# Patient Record
Sex: Female | Born: 1940 | Race: White | Hispanic: No | Smoking: Former smoker
Health system: Southern US, Community
[De-identification: ages and names within clinical notes are randomized; demographics above are authoritative.]

## PROBLEM LIST (undated history)

## (undated) DIAGNOSIS — E785 Hyperlipidemia, unspecified: Secondary | ICD-10-CM

## (undated) DIAGNOSIS — E079 Disorder of thyroid, unspecified: Secondary | ICD-10-CM

## (undated) DIAGNOSIS — E876 Hypokalemia: Secondary | ICD-10-CM

## (undated) DIAGNOSIS — R0600 Dyspnea, unspecified: Secondary | ICD-10-CM

## (undated) DIAGNOSIS — K5792 Diverticulitis of intestine, part unspecified, without perforation or abscess without bleeding: Secondary | ICD-10-CM

## (undated) HISTORY — PX: EYE SURGERY: SHX253

## (undated) HISTORY — DX: Dyspnea, unspecified: R06.00

## (undated) HISTORY — DX: Hypokalemia: E87.6

## (undated) HISTORY — DX: Hyperlipidemia, unspecified: E78.5

---

## 1999-11-04 ENCOUNTER — Other Ambulatory Visit: Admission: RE | Admit: 1999-11-04 | Discharge: 1999-11-04 | Payer: Self-pay | Admitting: Obstetrics and Gynecology

## 1999-11-21 ENCOUNTER — Encounter: Payer: Self-pay | Admitting: Obstetrics and Gynecology

## 1999-11-21 ENCOUNTER — Encounter: Admission: RE | Admit: 1999-11-21 | Discharge: 1999-11-21 | Payer: Self-pay | Admitting: Obstetrics and Gynecology

## 2000-11-01 ENCOUNTER — Other Ambulatory Visit: Admission: RE | Admit: 2000-11-01 | Discharge: 2000-11-01 | Payer: Self-pay | Admitting: Obstetrics and Gynecology

## 2000-11-21 ENCOUNTER — Encounter: Payer: Self-pay | Admitting: Obstetrics and Gynecology

## 2000-11-21 ENCOUNTER — Encounter: Admission: RE | Admit: 2000-11-21 | Discharge: 2000-11-21 | Payer: Self-pay | Admitting: Obstetrics and Gynecology

## 2001-11-01 ENCOUNTER — Other Ambulatory Visit: Admission: RE | Admit: 2001-11-01 | Discharge: 2001-11-01 | Payer: Self-pay | Admitting: Obstetrics and Gynecology

## 2001-11-25 ENCOUNTER — Encounter: Admission: RE | Admit: 2001-11-25 | Discharge: 2001-11-25 | Payer: Self-pay | Admitting: Obstetrics and Gynecology

## 2001-11-25 ENCOUNTER — Encounter: Payer: Self-pay | Admitting: Obstetrics and Gynecology

## 2002-10-15 ENCOUNTER — Other Ambulatory Visit: Admission: RE | Admit: 2002-10-15 | Discharge: 2002-10-15 | Payer: Self-pay | Admitting: Obstetrics and Gynecology

## 2003-01-23 ENCOUNTER — Encounter: Admission: RE | Admit: 2003-01-23 | Discharge: 2003-01-23 | Payer: Self-pay | Admitting: Obstetrics and Gynecology

## 2003-01-23 ENCOUNTER — Encounter: Payer: Self-pay | Admitting: Obstetrics and Gynecology

## 2003-11-19 ENCOUNTER — Other Ambulatory Visit: Admission: RE | Admit: 2003-11-19 | Discharge: 2003-11-19 | Payer: Self-pay | Admitting: Obstetrics and Gynecology

## 2004-01-26 ENCOUNTER — Ambulatory Visit (HOSPITAL_COMMUNITY): Admission: RE | Admit: 2004-01-26 | Discharge: 2004-01-26 | Payer: Self-pay | Admitting: Obstetrics and Gynecology

## 2004-11-24 ENCOUNTER — Other Ambulatory Visit: Admission: RE | Admit: 2004-11-24 | Discharge: 2004-11-24 | Payer: Self-pay | Admitting: Obstetrics and Gynecology

## 2005-01-27 ENCOUNTER — Ambulatory Visit (HOSPITAL_COMMUNITY): Admission: RE | Admit: 2005-01-27 | Discharge: 2005-01-27 | Payer: Self-pay | Admitting: Obstetrics and Gynecology

## 2005-04-18 ENCOUNTER — Ambulatory Visit (HOSPITAL_COMMUNITY): Admission: RE | Admit: 2005-04-18 | Discharge: 2005-04-19 | Payer: Self-pay | Admitting: Ophthalmology

## 2005-10-11 ENCOUNTER — Ambulatory Visit: Payer: Self-pay | Admitting: Orthopedic Surgery

## 2006-01-30 ENCOUNTER — Ambulatory Visit (HOSPITAL_COMMUNITY): Admission: RE | Admit: 2006-01-30 | Discharge: 2006-01-30 | Payer: Self-pay | Admitting: Obstetrics and Gynecology

## 2007-02-14 ENCOUNTER — Ambulatory Visit (HOSPITAL_COMMUNITY): Admission: RE | Admit: 2007-02-14 | Discharge: 2007-02-14 | Payer: Self-pay | Admitting: Obstetrics and Gynecology

## 2007-02-21 ENCOUNTER — Ambulatory Visit (HOSPITAL_COMMUNITY): Admission: RE | Admit: 2007-02-21 | Discharge: 2007-02-21 | Payer: Self-pay | Admitting: Ophthalmology

## 2008-02-18 ENCOUNTER — Ambulatory Visit (HOSPITAL_COMMUNITY): Admission: RE | Admit: 2008-02-18 | Discharge: 2008-02-18 | Payer: Self-pay | Admitting: Obstetrics and Gynecology

## 2008-05-08 ENCOUNTER — Observation Stay (HOSPITAL_COMMUNITY): Admission: EM | Admit: 2008-05-08 | Discharge: 2008-05-10 | Payer: Self-pay | Admitting: Emergency Medicine

## 2008-08-19 ENCOUNTER — Ambulatory Visit: Payer: Self-pay | Admitting: Gastroenterology

## 2008-09-04 ENCOUNTER — Ambulatory Visit: Payer: Self-pay | Admitting: Gastroenterology

## 2009-01-13 ENCOUNTER — Encounter: Payer: Self-pay | Admitting: Adult Health

## 2009-01-13 LAB — CONVERTED CEMR LAB
ALT: 11 units/L
Alkaline Phosphatase: 57 units/L
BUN: 12 mg/dL
Bilirubin, Direct: 1 mg/dL
Calcium: 9 mg/dL
Creatinine, Ser: 0.79 mg/dL
HCT: 41.1 %
Platelets: 254 10*3/uL
Sodium: 144 meq/L
Total Protein: 7 g/dL
Triglycerides: 58 mg/dL

## 2009-02-18 ENCOUNTER — Ambulatory Visit (HOSPITAL_COMMUNITY): Admission: RE | Admit: 2009-02-18 | Discharge: 2009-02-18 | Payer: Self-pay | Admitting: Obstetrics and Gynecology

## 2009-07-06 ENCOUNTER — Ambulatory Visit (HOSPITAL_COMMUNITY): Admission: RE | Admit: 2009-07-06 | Discharge: 2009-07-06 | Payer: Self-pay | Admitting: Family Medicine

## 2009-07-19 ENCOUNTER — Ambulatory Visit: Payer: Self-pay | Admitting: Cardiology

## 2009-07-19 ENCOUNTER — Observation Stay (HOSPITAL_COMMUNITY): Admission: EM | Admit: 2009-07-19 | Discharge: 2009-07-20 | Payer: Self-pay | Admitting: Emergency Medicine

## 2009-07-20 ENCOUNTER — Encounter: Payer: Self-pay | Admitting: Cardiology

## 2009-07-22 ENCOUNTER — Encounter (HOSPITAL_COMMUNITY): Admission: RE | Admit: 2009-07-22 | Discharge: 2009-08-05 | Payer: Self-pay | Admitting: Cardiology

## 2009-07-22 ENCOUNTER — Ambulatory Visit: Payer: Self-pay | Admitting: Cardiology

## 2009-07-25 ENCOUNTER — Ambulatory Visit: Payer: Self-pay | Admitting: Cardiovascular Disease

## 2009-07-25 ENCOUNTER — Observation Stay (HOSPITAL_COMMUNITY): Admission: EM | Admit: 2009-07-25 | Discharge: 2009-07-27 | Payer: Self-pay | Admitting: Internal Medicine

## 2009-07-25 ENCOUNTER — Encounter: Payer: Self-pay | Admitting: Emergency Medicine

## 2009-07-27 ENCOUNTER — Encounter: Payer: Self-pay | Admitting: Adult Health

## 2009-07-27 LAB — CONVERTED CEMR LAB
Hemoglobin: 12.2 g/dL
WBC: 10.9 10*3/uL

## 2009-08-11 ENCOUNTER — Ambulatory Visit: Payer: Self-pay | Admitting: Adult Health

## 2009-08-11 DIAGNOSIS — R079 Chest pain, unspecified: Secondary | ICD-10-CM | POA: Insufficient documentation

## 2009-08-11 LAB — CONVERTED CEMR LAB
MCV: 89.8 fL
Platelets: 204 10*3/uL
WBC: 10.9 10*3/uL

## 2010-03-01 ENCOUNTER — Ambulatory Visit (HOSPITAL_COMMUNITY): Admission: RE | Admit: 2010-03-01 | Discharge: 2010-03-01 | Payer: Self-pay | Admitting: Obstetrics and Gynecology

## 2011-02-06 ENCOUNTER — Other Ambulatory Visit (HOSPITAL_COMMUNITY): Payer: Self-pay | Admitting: Obstetrics and Gynecology

## 2011-02-06 DIAGNOSIS — Z1231 Encounter for screening mammogram for malignant neoplasm of breast: Secondary | ICD-10-CM

## 2011-02-10 LAB — CARDIAC PANEL(CRET KIN+CKTOT+MB+TROPI)
CK, MB: 1.1 ng/mL (ref 0.3–4.0)
CK, MB: 1.3 ng/mL (ref 0.3–4.0)
CK, MB: 1.4 ng/mL (ref 0.3–4.0)
Relative Index: 1.1 (ref 0.0–2.5)
Relative Index: 1.3 (ref 0.0–2.5)
Relative Index: INVALID (ref 0.0–2.5)
Total CK: 111 U/L (ref 7–177)
Total CK: 117 U/L (ref 7–177)
Total CK: 97 U/L (ref 7–177)
Troponin I: 0.01 ng/mL (ref 0.00–0.06)
Troponin I: 0.01 ng/mL (ref 0.00–0.06)
Troponin I: 0.02 ng/mL (ref 0.00–0.06)
Troponin I: 0.02 ng/mL (ref 0.00–0.06)
Troponin I: 0.02 ng/mL (ref 0.00–0.06)

## 2011-02-10 LAB — CBC
HCT: 33.4 % — ABNORMAL LOW (ref 36.0–46.0)
HCT: 36 % (ref 36.0–46.0)
HCT: 36.8 % (ref 36.0–46.0)
HCT: 37.6 % (ref 36.0–46.0)
Hemoglobin: 11.5 g/dL — ABNORMAL LOW (ref 12.0–15.0)
Hemoglobin: 12.2 g/dL (ref 12.0–15.0)
Hemoglobin: 12.8 g/dL (ref 12.0–15.0)
MCHC: 33.8 g/dL (ref 30.0–36.0)
MCHC: 34.4 g/dL (ref 30.0–36.0)
MCHC: 34.8 g/dL (ref 30.0–36.0)
MCV: 88.2 fL (ref 78.0–100.0)
MCV: 89.1 fL (ref 78.0–100.0)
MCV: 89.8 fL (ref 78.0–100.0)
MCV: 87.1 fL (ref 78.0–100.0)
Platelets: 204 K/uL (ref 150–400)
Platelets: 229 K/uL (ref 150–400)
Platelets: 230 K/uL (ref 150–400)
Platelets: 239 10*3/uL (ref 150–400)
RBC: 3.72 MIL/uL — ABNORMAL LOW (ref 3.87–5.11)
RBC: 4.04 MIL/uL (ref 3.87–5.11)
RBC: 4.17 MIL/uL (ref 3.87–5.11)
RDW: 13 % (ref 11.5–15.5)
RDW: 13 % (ref 11.5–15.5)
RDW: 13.1 % (ref 11.5–15.5)
WBC: 10.9 K/uL — ABNORMAL HIGH (ref 4.0–10.5)
WBC: 7.1 K/uL (ref 4.0–10.5)
WBC: 9.1 K/uL (ref 4.0–10.5)

## 2011-02-10 LAB — COMPREHENSIVE METABOLIC PANEL WITH GFR
ALT: 16 U/L (ref 0–35)
AST: 18 U/L (ref 0–37)
Albumin: 3.5 g/dL (ref 3.5–5.2)
Alkaline Phosphatase: 48 U/L (ref 39–117)
BUN: 7 mg/dL (ref 6–23)
CO2: 32 meq/L (ref 19–32)
Calcium: 9 mg/dL (ref 8.4–10.5)
Chloride: 105 meq/L (ref 96–112)
Creatinine, Ser: 0.89 mg/dL (ref 0.4–1.2)
GFR calc Af Amer: >60 mL/min (ref >60)
GFR calc non Af Amer: >60 mL/min (ref >60)
Glucose, Bld: 100 mg/dL — ABNORMAL HIGH (ref 70–99)
Potassium: 3.3 meq/L — ABNORMAL LOW (ref 3.5–5.1)
Sodium: 140 meq/L (ref 135–145)
Total Bilirubin: 1.4 mg/dL — ABNORMAL HIGH (ref 0.3–1.2)
Total Protein: 6.7 g/dL (ref 6.0–8.3)

## 2011-02-10 LAB — BASIC METABOLIC PANEL
BUN: 10 mg/dL (ref 6–23)
CO2: 22 mEq/L (ref 19–32)
CO2: 25 mEq/L (ref 19–32)
Chloride: 108 mEq/L (ref 96–112)
Creatinine, Ser: 0.72 mg/dL (ref 0.4–1.2)
GFR calc Af Amer: >60 mL/min (ref >60)
GFR calc Af Amer: >60 mL/min (ref >60)
GFR calc non Af Amer: >60 mL/min (ref >60)
Sodium: 139 mEq/L (ref 135–145)

## 2011-02-10 LAB — DIFFERENTIAL
Basophils Absolute: 0 K/uL (ref 0.0–0.1)
Basophils Relative: 0 % (ref 0–1)
Eosinophils Absolute: 0.1 K/uL (ref 0.0–0.7)
Eosinophils Relative: 2 % (ref 0–5)
Eosinophils Relative: 1 % (ref 0–5)
Lymphocytes Relative: 15 % (ref 12–46)
Lymphocytes Relative: 7 % — ABNORMAL LOW (ref 12–46)
Lymphs Abs: 1.1 K/uL (ref 0.7–4.0)
Lymphs Abs: 0.8 10*3/uL (ref 0.7–4.0)
Monocytes Absolute: 0.6 K/uL (ref 0.1–1.0)
Monocytes Absolute: 0.5 10*3/uL (ref 0.1–1.0)
Monocytes Relative: 8 % (ref 3–12)
Neutro Abs: 5.3 K/uL (ref 1.7–7.7)
Neutrophils Relative %: 74 % (ref 43–77)

## 2011-02-10 LAB — POCT CARDIAC MARKERS
CKMB, poc: <1 ng/mL — ABNORMAL LOW (ref 1.0–8.0)
CKMB, poc: <1 ng/mL — ABNORMAL LOW (ref 1.0–8.0)
Myoglobin, poc: 53.5 ng/mL (ref 12–200)
Troponin i, poc: <0.05 ng/mL (ref 0.00–0.09)
Troponin i, poc: <0.05 ng/mL (ref 0.00–0.09)
Troponin i, poc: <0.05 ng/mL (ref 0.00–0.09)

## 2011-02-10 LAB — URINE MICROSCOPIC-ADD ON

## 2011-02-10 LAB — URINALYSIS, ROUTINE W REFLEX MICROSCOPIC
Glucose, UA: NEGATIVE mg/dL
Protein, ur: NEGATIVE mg/dL

## 2011-02-10 LAB — BASIC METABOLIC PANEL WITH GFR
BUN: 8 mg/dL (ref 6–23)
CO2: 26 meq/L (ref 19–32)
Calcium: 8.8 mg/dL (ref 8.4–10.5)
Chloride: 104 meq/L (ref 96–112)
Creatinine, Ser: 0.78 mg/dL (ref 0.4–1.2)
GFR calc Af Amer: >60 mL/min (ref >60)
GFR calc non Af Amer: >60 mL/min (ref >60)
Glucose, Bld: 152 mg/dL — ABNORMAL HIGH (ref 70–99)
Potassium: 4.4 meq/L (ref 3.5–5.1)
Sodium: 138 meq/L (ref 135–145)

## 2011-02-10 LAB — APTT: aPTT: 26 seconds (ref 24–37)

## 2011-02-10 LAB — LIPID PANEL
Cholesterol: 175 mg/dL (ref 0–200)
HDL: 68 mg/dL (ref >39)
Triglycerides: 71 mg/dL (ref <150)

## 2011-02-10 LAB — D-DIMER, QUANTITATIVE: D-Dimer, Quant: 0.52 ug/mL-FEU — ABNORMAL HIGH (ref 0.00–0.48)

## 2011-02-10 LAB — PROTIME-INR
INR: 1.1 (ref 0.00–1.49)
Prothrombin Time: 13.9 seconds (ref 11.6–15.2)

## 2011-02-10 LAB — HEPARIN LEVEL (UNFRACTIONATED)
Heparin Unfractionated: 0.5 [IU]/mL (ref 0.30–0.70)
Heparin Unfractionated: <0.1 IU/mL — ABNORMAL LOW (ref 0.30–0.70)

## 2011-02-10 LAB — PHOSPHORUS: Phosphorus: 3.2 mg/dL (ref 2.3–4.6)

## 2011-03-03 ENCOUNTER — Ambulatory Visit (HOSPITAL_COMMUNITY)
Admission: RE | Admit: 2011-03-03 | Discharge: 2011-03-03 | Disposition: A | Discharge status: Home / Self Care | Payer: Medicare Other | Source: Ambulatory Visit | Attending: Obstetrics and Gynecology | Admitting: Obstetrics and Gynecology

## 2011-03-03 DIAGNOSIS — Z1231 Encounter for screening mammogram for malignant neoplasm of breast: Secondary | ICD-10-CM | POA: Insufficient documentation

## 2011-03-21 NOTE — H&P (Signed)
Angela Flowers, Angela Flowers                 ACCOUNT NO.:  [id number]   MEDICAL RECORD NO.:  [id number]          PATIENT TYPE:  EMS   LOCATION:  ED                            FACILITY:  [facility]   PHYSICIAN:  Lucita Ferrara, MD         DATE OF BIRTH:  [birth year]   DATE OF ADMISSION:  05/08/2008  DATE OF DISCHARGE:  LH                              HISTORY & PHYSICAL   PRIMARY CARE DOCTOR:  Dr. Phillips Odor.   CHIEF COMPLAINT:  The patient is a 70 year old with a chief complaint of  severe abdominal pain.   HISTORY OF PRESENT ILLNESS:  The patient is 70 year old who presents  with abdominal pain located mid epigastric, left lower quadrant, left  upper quadrant described as severe stabbing pain, accompanied by mild  diarrhea.  She was evaluated by Dr. Lamar Blinks office and was sent here  for further evaluation.  She has no vomiting.  She has no significant  heavy diarrhea.  No constipation.  She denies dysuria or hematuria.  She  denies hematochezia or hematemesis.  Apparently, this abdominal pain is  getting progressively worse.  There is also some periumbilical pain.  She denies urinary frequency, urgency or burning.  She denies changes in  her dietary habits.  She denies recent travel, fevers, chills or sick  contacts.  Pain is 8/10 in intensity and getting progressively worse.  She was evaluated here in the emergency room.  I was asked to admit for  complicated diverticulitis.   PAST MEDICAL HISTORY:  1. Status post cataract surgery.  2. Repair of retinal detachment by laser photocoagulation.   SOCIAL HISTORY:  She denies drugs or alcohol and she currently smokes  half-pack per day.   ALLERGIES:  NO KNOWN DRUG ALLERGIES.   MEDICATIONS:  None.   REVIEW OF SYSTEMS:  Otherwise negative.   ALLERGIES:  NO KNOWN DRUG ALLERGIES.   PHYSICAL EXAMINATION:  GENERAL:  The patient is in no acute distress.  VITAL SIGNS:  Blood pressure 126/64, pulse 89, respirations 16,  temperature 97.6.  HEENT:   Normocephalic, atraumatic.  Sclerae anicteric.  PERLA.  Extraocular muscles intact.  NECK:  Supple.  No JVD, no carotid bruits.  CARDIOVASCULAR:  S1-S2.  Regular rate and rhythm.  No murmurs, rubs or  clicks.  ABDOMEN:  Soft, nontender, nondistended.  Positive bowel sounds.  LUNGS:  Clear to auscultation bilaterally.  No rhonchi, rales or wheeze.  EXTREMITIES:  No clubbing, cyanosis or edema.  NEURO:  Patient is alert and oriented x3.  Cranial nerves II-XII are  grossly intact.   CT scan of the pelvis with contrast media showed diverticulitis at the  junction of the distal, descending and sigmoid colon.  No abscess or  colonic diverticuli.  Appendix appears normal.  Lipase 19, albumin 3.7,  total protein 7.6.  AST, ALT and alk phos 15, 13 and 55 respectively.  Basic metabolic panel normal.  Urinalysis negative.  CBC normal.   EMERGENCY ROOM COURSE:  The patient was started on antibiotics here in  the emergency room and IV hydration.   ASSESSMENT/PLAN:  This patient is a 70 year old female with a fairly  uncomplicated course of diverticulitis.  We will go ahead and admit the  patient to the med/surg floor.  We will start the patient on  ciprofloxacin and metronidazole IV for now, and we will go ahead and  change to p.o. in the morning.  Phenergan and Reglan in case there is  any nausea or vomiting.  N.p.o. overnight.  Advance diet to a low  residue diet in the morning if her course is uncomplicated.  The patient  currently does not have any microperforations, no abscesses, and she has  no leukocytosis.  She will likely be sent home, especially if this is  her first bout of diverticulitis and if there is no concern of  perforation.  She will, however, need to be watched at least overnight.  The rest of the plans are dependant on her progress.  Obviously if her  course changes, will get appropriate consultations involved, including  GI and surgery.      Lucita Ferrara, MD   Electronically Signed     RR/MEDQ  D:  05/08/2008  T:  05/08/2008  Job:  [id number]

## 2011-03-21 NOTE — Group Therapy Note (Signed)
NAMEADIE, Angela Flowers                 ACCOUNT NO.:  [id number]   MEDICAL RECORD NO.:  [id number]          PATIENT TYPE:  OBV   LOCATION:  A303                          FACILITY:  [facility]   PHYSICIAN:  Margaretmary Dys, M.D.DATE OF BIRTH:  January 09, 1941   DATE OF PROCEDURE:  05/09/2008  DATE OF DISCHARGE:                                 PROGRESS NOTE   SUBJECTIVE:  The patient was admitted yesterday with acute abdominal  pain and CT scan of the abdomen consistent with an acute diverticulitis.  The patient feels better today, states her abdominal pain is improved.  She denies any fevers or chills.  She did have a clear liquid diet this  morning which she actually tolerated.  She has no nausea or vomiting.  She continues to have some diarrhea.  She says the episodes are slightly  better this morning.  Abdominal pain is also still around the left lower  quadrant, about 2/10 now.   OBJECTIVE:  GENERAL:  Conscious, alert, comfortable, not in acute  distress, well oriented in time, place and person.  VITAL SIGNS:  Blood pressure is 105/58 with a pulse of 72, respirations  18, temperature 97.9 degrees Fahrenheit.  HEENT:  Normocephalic, atraumatic.  Oral mucosa was moist with no  exudates.  NECK:  Neck was supple.  No JVD or lymphadenopathy.  LUNGS:  Were clear clinically with good air entry bilaterally.  HEART:  S1-S2 regular.  No gallops or rubs.  ABDOMEN:  Abdomen was soft.  The patient has some mild tenderness in the  left lower quadrant with no guarding or rigidity.  There was no rebound  tenderness.  Bowel sounds were positive.  EXTREMITIES:  No pitting pedal edema, no calf induration or tenderness  was noted.  CNS:  Exam was grossly intact with no focal neurological deficits.   LABORATORY - DIAGNOSTIC DATA:  White blood cell count was 10.2,  hemoglobin of 12.2, hematocrit 36, platelet count was 227 with 80%  neutrophils.  Sodium was 136, potassium 3.6, chloride of 102, CO2 is 29,  glucose 126, BUN of 6, creatinine was 0.8.  Total bilirubin was 1.3.  AST 16, ALT of 11.  Albumin was 3.3, calcium 8.8, phosphorus 3.5,  magnesium 2.1.  Blood cultures were negative thus far.   ASSESSMENT:  1. Acute abdominal pain.  2. Acute colonic diverticulitis.  3. Acute diarrhea.  4. Mild dehydration.   PLAN:  1. Would continue on antibiotics but will switch her to oral Cipro and      Flagyl as the patient is tolerating P.O very well.  2. Will also change her IV fluids to [facility].  3. Will continue all other medications including Phenergan and Reglan      for pain control, nausea and vomiting.  4. Will evaluate for possible discharge in the morning on oral      antibiotics.  5. I have informed the patient that she will need a colonoscopy as      outpatient.      Margaretmary Dys, M.D.  Electronically Signed     AM/MEDQ  D:  05/09/2008  T:  05/09/2008  Job:  [id number]

## 2011-03-21 NOTE — Discharge Summary (Signed)
NAMESAAYA, PROCELL                 ACCOUNT NO.:  [id number]   MEDICAL RECORD NO.:  [id number]          PATIENT TYPE:  OBV   LOCATION:  A303                          FACILITY:  [facility]   PHYSICIAN:  Margaretmary Dys, M.D.DATE OF BIRTH:  1940/12/02   DATE OF ADMISSION:  05/08/2008  DATE OF DISCHARGE:  07/05/2009LH                               DISCHARGE SUMMARY   DISCHARGE DIAGNOSES:  1. Acute abdominal pain.  2. Acute colonic diverticulitis.   DISCHARGE MEDICATIONS:  1. Cipro 500 mg (ciprofloxacin) p.o. b.i.d. for 10 days.  2. Metronidazole 500 mg p.o. b.i.d. for 10 days.  3. Vicodin 1-2 tablets every 6 hours for severe pain.   FOLLOWUP:  The patient is to follow up with Dr. Phillips Odor, her primary  care physician, within the next 7 days.   ACTIVITY:  Increase activity slowly.   SPECIAL INSTRUCTIONS:  1. The patient was advised to complete antibiotics.  2. The patient was advised to return to the emergency room if      abdominal pain recurs.  3. The patient was advised to quit smoking.   PERTINENT LABORATORY DATA:  On the day of admission the CT scan of the  abdomen showed diverticulitis at the sigmoid colon junction.  There were  no abscesses.  Appendix was normal.  Lipase was 19, albumin 3.7, total  protein was 7.6.  Liver function test was normal.  Basic metabolic  profile was normal.  Urinalysis was negative.  CBC was normal.   HOSPITAL COURSE:  She is a 70 year old female who presented with acute  abdominal pain mostly in the epigastric left lower quadrant and left  upper quadrant which she described as severe, stabbing pain.  She was  seen in Dr. Lamar Blinks office and was then sent over to the emergency  room for further evaluation.  She had no nausea or vomiting, no  frequency or urgency, no fevers or chills.  Pain was 8/10 in intensity.  The patient was subsequently evaluated in the emergency room with a CT  scan of her abdomen and pelvis which confirmed acute  diverticulitis.  The patient was subsequently admitted to the hospital and started on  intravenous ciprofloxacin and intravenous metronidazole.  The patient  was also placed on IV hydration.  The patient's course improved with  significant improvement in her pain.  The patient was also started on  clear liquid diet the next day which she tolerated very  well.  The patient's diarrhea episodes have also stopped.  Overall the  patient did fairly well and today, May 10, 2008, the patient is now  being discharged home.  She is to follow up as mentioned above.   DIET:  Heart-healthy diet.      Margaretmary Dys, M.D.  Electronically Signed     AM/MEDQ  D:  05/10/2008  T:  05/10/2008  Job:  [id number]

## 2011-03-24 NOTE — Op Note (Signed)
Angela Flowers, Angela Flowers                 ACCOUNT NO.:  [id number]   MEDICAL RECORD NO.:  [id number]          PATIENT TYPE:  OIB   LOCATION:  2550                         FACILITY:  [facility]   PHYSICIAN:  Beulah Gandy. Ashley Royalty, M.D. DATE OF BIRTH:  [birth year]   DATE OF PROCEDURE:  04/18/2005  DATE OF DISCHARGE:                                 OPERATIVE REPORT   ADMISSION DIAGNOSIS:  Macular hole right eye.   PROCEDURE:  Retinal photocoagulation, pars plana vitrectomy, gas fluid  exchange, membrane peel, serum patch and perfluoropropane 17% injection  right eye.   SURGEON:  Dr. Alan Mulder.   ASSISTANT:  Albert L. Lundquist, P.A.   ANESTHESIA:  General.   DETAILS:  Usual prep and drape. Peritomies at 8, 10 and 2 o'clock.  Infusion  port anchored into place at 8 o'clock. Lighted pick placed at 10 and 2  o'clock, respectively. The contact lens ring anchored into place at 6 and  12. Methylcellulose placed on the cornea and the flat contact lens was  placed.  Pars plana vitrectomy was begun just behind the crystalline lens.  The vitreous membranes were encountered and carefully removed under low  suction and rapid cutting. Once the core vitreous was removed, the silicone  tip suction line was drawn down to the macular surface and the fish strike  sign was used to engage posterior hyaloid and lifted from its attachments to  the disk and the macular hole. The 27-gauge pick was used for this maneuver  and the membranes were peeled. The biome viewing system was then moved into  place and the peripheral vitrectomy was carried out removing all peripheral  vitreous as well as the freed up vitreous from the macular hole in area.  Once this was accomplished, the periphery was inspected with indirect  ophthalmoscope and laser was placed, 577 burns were placed in two rows  around the retinal periphery. The power was 400 milliwatts, 1000 microns  each and 0.05 seconds each. Once this was accomplished, gas  fluid exchange  was carried out.  Sufficient time was allowed for additional fluid to track  down the walls without __________ posterior segment. The serum patch was  prepared during this time and the perfluoropropane was mixed to 17%. The  additional fluid was removed with the [employer] brush.  Serum patch  was delivered.  Perfluoropropane was injected to exchange for intravitreal  gas. Prior to the instruments being removed from the eye, the diamond dusted  membrane scraper was used to lift the internal limiting membrane from its  attachments to the edge of the macular hole. The instruments were removed  from the eye and 9-0 nylon was used to close the sclerotomy site. They were  tested and found to be tight. The conjunctiva was closed with wet-field  cautery. Polymyxin and gentamicin were irrigated into tenon space.  Decadron  10 mg was injected into the lower subconjunctival space. Marcaine was  injected around the globe for postop pain. Atropine solution was applied.  TobraDex ophthalmic ointment was applied. Closing pressure was 10 with a  [place] tonometer.  A patch and shield were placed. The patient was  awakened, taken to recovery in satisfactory condition.   COMPLICATIONS:  None.   DURATION:  One hour.       JDM/MEDQ  D:  04/18/2005  T:  04/18/2005  Job:  [id number]

## 2011-08-03 LAB — DIFFERENTIAL
Basophils Absolute: 0.1
Basophils Relative: 1
Basophils Relative: 1
Eosinophils Absolute: 0.1
Eosinophils Relative: 0
Eosinophils Relative: 1
Eosinophils Relative: 3
Lymphocytes Relative: 12
Lymphocytes Relative: 8 — ABNORMAL LOW
Lymphs Abs: 0.9
Monocytes Absolute: 0.7
Monocytes Absolute: 0.8
Monocytes Absolute: 0.8
Monocytes Relative: 10
Neutro Abs: 5.4
Neutro Abs: 9.6 — ABNORMAL HIGH

## 2011-08-03 LAB — BASIC METABOLIC PANEL
CO2: 30
Calcium: 8.6
Chloride: 105
GFR calc Af Amer: >60
GFR calc Af Amer: >60
GFR calc non Af Amer: >60
Glucose, Bld: 104 — ABNORMAL HIGH
Potassium: 3.5
Potassium: 4.4
Sodium: 138
Sodium: 141

## 2011-08-03 LAB — URINALYSIS, ROUTINE W REFLEX MICROSCOPIC
Glucose, UA: NEGATIVE
Leukocytes, UA: NEGATIVE
Specific Gravity, Urine: <1.005 — ABNORMAL LOW
pH: 6.5

## 2011-08-03 LAB — CBC
HCT: 33.2 — ABNORMAL LOW
HCT: 38.8
Hemoglobin: 11.4 — ABNORMAL LOW
Hemoglobin: 12.2
Hemoglobin: 13.1
MCHC: 34
MCHC: 34.2
MCV: 87.6
RBC: 3.79 — ABNORMAL LOW
RBC: 4.11
RDW: 12.7
WBC: 10.2
WBC: 11.3 — ABNORMAL HIGH

## 2011-08-03 LAB — HEPATIC FUNCTION PANEL
ALT: 13
AST: 15
Alkaline Phosphatase: 55
Bilirubin, Direct: 0.2
Indirect Bilirubin: 1.5 — ABNORMAL HIGH
Total Protein: 7.6

## 2011-08-03 LAB — COMPREHENSIVE METABOLIC PANEL
ALT: 11
AST: 16
Alkaline Phosphatase: 48
CO2: 29
Calcium: 8.8
Chloride: 102
GFR calc Af Amer: >60
GFR calc non Af Amer: >60
Glucose, Bld: 126 — ABNORMAL HIGH
Potassium: 3.6
Sodium: 136
Total Bilirubin: 1.3 — ABNORMAL HIGH

## 2011-08-03 LAB — URINE MICROSCOPIC-ADD ON

## 2011-08-03 LAB — CULTURE, BLOOD (ROUTINE X 2): Culture: NO GROWTH

## 2011-08-03 LAB — MAGNESIUM: Magnesium: 2.1

## 2011-10-04 ENCOUNTER — Encounter: Payer: Self-pay | Admitting: Adult Health

## 2012-02-06 ENCOUNTER — Other Ambulatory Visit (HOSPITAL_COMMUNITY): Payer: Self-pay | Admitting: Obstetrics and Gynecology

## 2012-02-06 DIAGNOSIS — Z1231 Encounter for screening mammogram for malignant neoplasm of breast: Secondary | ICD-10-CM

## 2012-03-05 ENCOUNTER — Ambulatory Visit (HOSPITAL_COMMUNITY)
Admission: RE | Admit: 2012-03-05 | Discharge: 2012-03-05 | Disposition: A | Discharge status: Home / Self Care | Payer: BC Managed Care – PPO | Source: Ambulatory Visit | Attending: Obstetrics and Gynecology | Admitting: Obstetrics and Gynecology

## 2012-03-05 DIAGNOSIS — Z1231 Encounter for screening mammogram for malignant neoplasm of breast: Secondary | ICD-10-CM | POA: Insufficient documentation

## 2012-03-06 ENCOUNTER — Other Ambulatory Visit (HOSPITAL_COMMUNITY): Payer: Self-pay | Admitting: Family Medicine

## 2012-03-06 DIAGNOSIS — Z139 Encounter for screening, unspecified: Secondary | ICD-10-CM

## 2012-03-13 ENCOUNTER — Ambulatory Visit (HOSPITAL_COMMUNITY)
Admission: RE | Admit: 2012-03-13 | Discharge: 2012-03-13 | Disposition: A | Discharge status: Home / Self Care | Payer: BC Managed Care – PPO | Source: Ambulatory Visit | Attending: Family Medicine | Admitting: Family Medicine

## 2012-03-13 DIAGNOSIS — Z780 Asymptomatic menopausal state: Secondary | ICD-10-CM | POA: Insufficient documentation

## 2012-03-13 DIAGNOSIS — Z13820 Encounter for screening for osteoporosis: Secondary | ICD-10-CM | POA: Insufficient documentation

## 2012-03-13 DIAGNOSIS — M899 Disorder of bone, unspecified: Secondary | ICD-10-CM | POA: Insufficient documentation

## 2012-03-13 DIAGNOSIS — Z139 Encounter for screening, unspecified: Secondary | ICD-10-CM

## 2012-03-13 DIAGNOSIS — M949 Disorder of cartilage, unspecified: Secondary | ICD-10-CM | POA: Insufficient documentation

## 2013-03-10 ENCOUNTER — Other Ambulatory Visit (HOSPITAL_COMMUNITY): Payer: Self-pay | Admitting: Obstetrics and Gynecology

## 2013-03-10 DIAGNOSIS — Z1231 Encounter for screening mammogram for malignant neoplasm of breast: Secondary | ICD-10-CM

## 2013-03-18 ENCOUNTER — Ambulatory Visit (HOSPITAL_COMMUNITY)
Admission: RE | Admit: 2013-03-18 | Discharge: 2013-03-18 | Disposition: A | Discharge status: Home / Self Care | Payer: BC Managed Care – PPO | Source: Ambulatory Visit | Attending: Obstetrics and Gynecology | Admitting: Obstetrics and Gynecology

## 2013-03-18 DIAGNOSIS — Z1231 Encounter for screening mammogram for malignant neoplasm of breast: Secondary | ICD-10-CM

## 2013-09-11 ENCOUNTER — Encounter (INDEPENDENT_AMBULATORY_CARE_PROVIDER_SITE_OTHER): Payer: Self-pay | Admitting: *Deleted

## 2013-09-16 ENCOUNTER — Other Ambulatory Visit (INDEPENDENT_AMBULATORY_CARE_PROVIDER_SITE_OTHER): Payer: Self-pay | Admitting: *Deleted

## 2013-09-16 ENCOUNTER — Encounter (INDEPENDENT_AMBULATORY_CARE_PROVIDER_SITE_OTHER): Payer: Self-pay | Admitting: Internal Medicine

## 2013-09-16 ENCOUNTER — Ambulatory Visit (INDEPENDENT_AMBULATORY_CARE_PROVIDER_SITE_OTHER): Payer: BC Managed Care – PPO | Admitting: Internal Medicine

## 2013-09-16 ENCOUNTER — Telehealth (INDEPENDENT_AMBULATORY_CARE_PROVIDER_SITE_OTHER): Payer: Self-pay | Admitting: *Deleted

## 2013-09-16 VITALS — BP 110/56 | HR 84 | Temp 98.3°F | Ht 63.0 in | Wt 144.6 lb

## 2013-09-16 DIAGNOSIS — Z1211 Encounter for screening for malignant neoplasm of colon: Secondary | ICD-10-CM

## 2013-09-16 DIAGNOSIS — R197 Diarrhea, unspecified: Secondary | ICD-10-CM

## 2013-09-16 DIAGNOSIS — R195 Other fecal abnormalities: Secondary | ICD-10-CM

## 2013-09-16 MED ORDER — PEG 3350-KCL-NA BICARB-NACL 420 G PO SOLR: 4000.0000 mL | Freq: Once | ORAL | Status: DC

## 2013-09-16 NOTE — Telephone Encounter (Signed)
Patient needs trilyte 

## 2013-09-16 NOTE — Progress Notes (Signed)
Subjective:     Patient ID: Angela Flowers, female   DOB: [birth year], 72 y.o.   MRN: [id number]  HPI Referred to our office by Dr. Phillips Odor for diarrhea and diverticulitis.  The diarrhea started a couple of months. She denies previous hx of diarrhea. She tells me she has occasionally bouts of diarrhea. She will have diarrhea about once a week.  When she has diarrhea, it will hurt across her abdomen (cramps).  When she has diarrhea she will have greater than 10 loose stools. No melena or bright red rectal bleeding. Pain is not localized in the rt lower quadrant.  If she take the anti-diarrheal, she becomes constipated. She has increased fiber in her diet.  She has a hx of diverticulitis.  No recent antibiotics.  She denies any fever. Appetite is good. No weight loss.  She usually has a BM every 3 days.    Her last colonoscopy was 2009 by Dr. Jarold Motto: average risk screening: Findings  - NORMAL EXAM: Cecum to Splenic Flexure. Not Seen: Polyps. AVM's.  Colitis. Tumors. Crohn's.  - DIVERTICULOSIS: Splenic Flexure to Sigmoid Colon. Not bleeding.  ICD9: Diverticulosis, Colon: 562.10. Comments: Thickened red folds...  .  - STRICTURE / STENOSIS: Descending Colon. Comments: Strictured  sigmoid area....dilated with the scope....  - NORMAL EXAM: Sigmoid Colon to Rectum. Polyps. AVM's. Colitis.  Tumors. Crohn's. Hemorrhoids.   Hx of diverticulitis. CT abdomen/pelvis with CM:  IMPRESSION:  1. Diverticulitis of the junction of the distal descending and  sigmoid colon. No abscess. Scattered colonic diverticula.  2. Appendix appears normal.  Review of Systems See hpi Current Outpatient Prescriptions  Medication Sig Dispense Refill  . aspirin 81 MG tablet Take 81 mg by mouth daily.        Marland Kitchen ibuprofen (ADVIL,MOTRIN) 200 MG tablet Take 200 mg by mouth every 6 (six) hours as needed.      Marland Kitchen levothyroxine (SYNTHROID, LEVOTHROID) 50 MCG tablet Take 50 mcg by mouth daily before breakfast.      .  Probiotic Product (ALIGN PO) Take by mouth.       No current facility-administered medications for this visit.   Past Medical History  Diagnosis Date  . Dyspnea   . Elevated lipids   . Chest pain   . Hypokalemia    Past Surgical History  Procedure Laterality Date  . Eye surgery      "Hole in eye".    No Known Allergies     Objective:   Physical Exam  Filed Vitals:   09/16/13 1604  BP: 110/56  Pulse: 84  Temp: 98.3 F (36.8 C)  Height: 5\' 3"  (1.6 m)  Weight: 144 lb 9.6 oz (65.59 kg)   Alert and oriented. Skin warm and dry. Oral mucosa is moist.   . Sclera anicteric, conjunctivae is pink. Thyroid not enlarged. No cervical lymphadenopathy. Lungs clear. Heart regular rate and rhythm.  Abdomen is soft. Bowel sounds are positive. No hepatomegaly. No abdominal masses felt. No tenderness.  No edema to lower extremities.       Assessment:     Change in stool. Hx of diverticulitis. Colonic neoplasm needs to be ruled out.     Plan:    Colonoscopy. The risks and benefits such as perforation, bleeding, and infection were reviewed with the patient and is agreeable.

## 2013-09-16 NOTE — Patient Instructions (Signed)
Colonoscopy with Dr. Rehman 

## 2013-09-29 ENCOUNTER — Encounter (HOSPITAL_COMMUNITY): Payer: Self-pay | Admitting: Pharmacy Technician

## 2013-09-30 ENCOUNTER — Encounter (HOSPITAL_COMMUNITY): Payer: Self-pay | Admitting: Emergency Medicine

## 2013-09-30 ENCOUNTER — Emergency Department (HOSPITAL_COMMUNITY): Payer: BC Managed Care – PPO

## 2013-09-30 ENCOUNTER — Observation Stay (HOSPITAL_COMMUNITY)
Admission: EM | Admit: 2013-09-30 | Discharge: 2013-09-30 | Disposition: A | Discharge status: Home / Self Care | Payer: BC Managed Care – PPO | Attending: Internal Medicine | Admitting: Internal Medicine

## 2013-09-30 DIAGNOSIS — K5792 Diverticulitis of intestine, part unspecified, without perforation or abscess without bleeding: Secondary | ICD-10-CM | POA: Diagnosis present

## 2013-09-30 DIAGNOSIS — R197 Diarrhea, unspecified: Secondary | ICD-10-CM | POA: Diagnosis present

## 2013-09-30 DIAGNOSIS — R1084 Generalized abdominal pain: Principal | ICD-10-CM | POA: Insufficient documentation

## 2013-09-30 DIAGNOSIS — R112 Nausea with vomiting, unspecified: Secondary | ICD-10-CM | POA: Insufficient documentation

## 2013-09-30 DIAGNOSIS — K5732 Diverticulitis of large intestine without perforation or abscess without bleeding: Secondary | ICD-10-CM

## 2013-09-30 DIAGNOSIS — D72829 Elevated white blood cell count, unspecified: Secondary | ICD-10-CM | POA: Diagnosis present

## 2013-09-30 DIAGNOSIS — R109 Unspecified abdominal pain: Secondary | ICD-10-CM | POA: Diagnosis present

## 2013-09-30 HISTORY — DX: Diverticulitis of intestine, part unspecified, without perforation or abscess without bleeding: K57.92

## 2013-09-30 HISTORY — DX: Disorder of thyroid, unspecified: E07.9

## 2013-09-30 LAB — CBC WITH DIFFERENTIAL/PLATELET
Basophils Absolute: 0.1 10*3/uL (ref 0.0–0.1)
Basophils Relative: 0 % (ref 0–1)
Eosinophils Absolute: 0 10*3/uL (ref 0.0–0.7)
Hemoglobin: 13.7 g/dL (ref 12.0–15.0)
MCH: 29.1 pg (ref 26.0–34.0)
MCHC: 33 g/dL (ref 30.0–36.0)
Monocytes Absolute: 0.6 10*3/uL (ref 0.1–1.0)
Monocytes Relative: 4 % (ref 3–12)
Neutrophils Relative %: 90 % — ABNORMAL HIGH (ref 43–77)
RDW: 13 % (ref 11.5–15.5)

## 2013-09-30 LAB — BASIC METABOLIC PANEL
BUN: 11 mg/dL (ref 6–23)
Creatinine, Ser: 0.81 mg/dL (ref 0.50–1.10)
GFR calc Af Amer: 83 mL/min — ABNORMAL LOW (ref >90)
GFR calc non Af Amer: 71 mL/min — ABNORMAL LOW (ref >90)

## 2013-09-30 LAB — URINALYSIS, ROUTINE W REFLEX MICROSCOPIC
Bilirubin Urine: NEGATIVE
Ketones, ur: NEGATIVE mg/dL
Nitrite: NEGATIVE
Protein, ur: NEGATIVE mg/dL
Urobilinogen, UA: 0.2 mg/dL (ref 0.0–1.0)

## 2013-09-30 LAB — HEPATIC FUNCTION PANEL
Albumin: 4 g/dL (ref 3.5–5.2)
Alkaline Phosphatase: 59 U/L (ref 39–117)
Indirect Bilirubin: 1.3 mg/dL — ABNORMAL HIGH (ref 0.3–0.9)
Total Bilirubin: 1.5 mg/dL — ABNORMAL HIGH (ref 0.3–1.2)
Total Protein: 8.1 g/dL (ref 6.0–8.3)

## 2013-09-30 LAB — LIPASE, BLOOD: Lipase: 40 U/L (ref 11–59)

## 2013-09-30 MED ORDER — METRONIDAZOLE IN NACL 5-0.79 MG/ML-% IV SOLN
500.0000 mg | Freq: Once | INTRAVENOUS | Status: AC
  Administered 2013-09-30: 500 mg via INTRAVENOUS
  Filled 2013-09-30: qty 100

## 2013-09-30 MED ORDER — IOHEXOL 300 MG/ML  SOLN
50.0000 mL | Freq: Once | INTRAMUSCULAR | Status: AC | PRN
  Administered 2013-09-30: 50 mL via ORAL

## 2013-09-30 MED ORDER — IOHEXOL 300 MG/ML  SOLN
100.0000 mL | Freq: Once | INTRAMUSCULAR | Status: AC | PRN
  Administered 2013-09-30: 100 mL via INTRAVENOUS

## 2013-09-30 MED ORDER — OXYCODONE-ACETAMINOPHEN 5-325 MG PO TABS: 1.0000 | ORAL_TABLET | ORAL | Status: DC | PRN

## 2013-09-30 MED ORDER — CIPROFLOXACIN IN D5W 400 MG/200ML IV SOLN
400.0000 mg | Freq: Once | INTRAVENOUS | Status: AC
  Administered 2013-09-30: 400 mg via INTRAVENOUS
  Filled 2013-09-30: qty 200

## 2013-09-30 MED ORDER — ONDANSETRON HCL 4 MG/2ML IJ SOLN
4.0000 mg | INTRAMUSCULAR | Status: DC | PRN
  Administered 2013-09-30: 4 mg via INTRAVENOUS
  Filled 2013-09-30: qty 2

## 2013-09-30 MED ORDER — MORPHINE SULFATE 2 MG/ML IJ SOLN
2.0000 mg | INTRAMUSCULAR | Status: DC | PRN
  Administered 2013-09-30: 2 mg via INTRAVENOUS
  Filled 2013-09-30: qty 1

## 2013-09-30 MED ORDER — CIPROFLOXACIN HCL 500 MG PO TABS: 500.0000 mg | ORAL_TABLET | Freq: Two times a day (BID) | ORAL | Status: DC

## 2013-09-30 MED ORDER — METRONIDAZOLE 500 MG PO TABS: 500.0000 mg | ORAL_TABLET | Freq: Three times a day (TID) | ORAL | Status: AC

## 2013-09-30 MED ORDER — SODIUM CHLORIDE 0.9 % IV SOLN
INTRAVENOUS | Status: DC
  Administered 2013-09-30: 14:00:00 via INTRAVENOUS

## 2013-09-30 NOTE — Consult Note (Signed)
Patient seen and examined, agree with note as above per Toya Smothers, NP.  Patient presents to the emergency room with abdominal pain and diarrhea. CT the abdomen pelvis indicates an acute uncomplicated diverticulitis. Patient has history of the same. She had some nausea and some retching this morning but has not had any since. Her abdominal pain improved in the emergency room without any medications. On exam, her abdomen is soft with only mild tenderness on palpation. I have offered the patient a trial of oral antibiotics. She is agreeable for this. Currently, she is not febrile, not vomiting. She feels that she will be able to keep down her antibiotics. She is advised that if her pain acutely worsens, she develops high fevers or she is unable to tolerate antibiotics due to persistent vomiting, then she would need to return to the hospital for intravenous antibiotics. She reports being scheduled for a colonoscopy next week with Dr. Karilyn Cota. I will forward a copy of this consultation note to Dr. Karilyn Cota. Her colonoscopy may need to be delayed due to her acute diverticulitis.  MEMON,JEHANZEB

## 2013-09-30 NOTE — Consult Note (Signed)
[facility] Medical Consultation  Angela Flowers ZOX:[id number] DOB: November 12, 1940 DOA: 09/30/2013 PCP: Colette Ribas, MD   Requesting physician: Clarene Duke Date of consultation: 09/30/13 Reason for consultation: admission   Impression/Recommendations Principal Problem:   Acute diverticulitis: CT yields uncomplicated distal and descending sigmoid colon. The time of exam pain is manageable and she has not required any pain management in the emergency department. She denies any nausea and is tolerating clear liquids without problem. In the emergency department she received one dose of Cipro and one dose of Flagyl intravenously. We'll transition these medications to by mouth form and discharged from the emergency department. Patient has been educated to avoid aspirin/NSAIDS at this point. Will provide low dose pain medicine at discharge. Have also recommended that she maintain a clear liquid diet for 24 hours and advance her diet as tolerated. She has been instructed that if her symptoms fail to continue to improve and/or worsen to come back to the emergency room. Patient verbalizes understanding and agreement. Active Problems:   Diarrhea: History of 3 episodes during the night last night. No episodes today or during her stay in the emergency department. Tolerating a clear liquid diet without problem    Leukocytosis: Likely related to #1. Patient is afebrile and hemodynamically stable. We'll be discharging her with 10 day course of Cipro and Flagyl by mouth. Recommend followup with her PCP in one week's time.    Abdominal pain: Much improved at the time of my exam in the emergency department. During her time in the emergency department she has required no pain medicine. Will discharge her with some Vicodin.     Chief Complaint: Nausea vomiting abdominal pain  HPI:  This [name] is a very pleasant 72 year old female with the past medical history that includes diverticulitis, hypertension,  hyperlipidemia who presents to the emergency department with chief complaint of abdominal pain, nausea with vomiting. She states that last night she developed sudden stabbing cramp-like pain in her lower abdomen. She was awakened with this pain. Associated symptoms include 3 episodes of loose stool and several episodes of dry heaving. She reports that the stool is loose but brown. She denies dark stool or bright red blood per rectum. She states that the pain was constant and rated at a 6-7/10. This morning she got up and went to work per her usual but the pain did not subside. She went home and called her primary care provider who recommended that she come to the emergency department. She denies any fever chills chest pain palpitations cough headache dizziness. She denies any decreased appetite or unintentional weight loss. He states a history of diverticulitis but has not had an episode and "many many years". She states that since she drank the contrast for her CT scan her abdomen has felt slightly bloated but the pain has diminished. Workup in the emergency room significant for a white count of 15.1 and a CT scan that yields uncomplicated distal descending and proximal sigmoid diverticulitis. Urinalysis with a few bacteria RBCs too numerous to count otherwise unremarkable. Vital signs are stable in the emergency department. At the time of my exam patient reports pain manageable. Chart review indicates patient has not required pain medicine.   Review of Systems:  10 point review of systems completed and all systems are negative except as indicated in the history of present illness  Past Medical History  Diagnosis Date  . Dyspnea   . Elevated lipids   . Chest pain   . Hypokalemia   .  Diverticulitis   . Thyroid disease    Past Surgical History  Procedure Laterality Date  . Eye surgery      "Hole in eye".    Social History:  reports that she has quit smoking. She does not have any smokeless tobacco  history on file. She reports that she does not drink alcohol or use illicit drugs. 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 She is independent with her ADLs No Known Allergies History reviewed. No pertinent family history. family medical history reviewed and noncontributory to this consultation.  Prior to Admission medications   Medication Sig Start Date End Date Taking? Authorizing Provider  aspirin EC 81 MG tablet Take 81 mg by mouth daily.   Yes Historical Provider, MD  ibuprofen (ADVIL,MOTRIN) 200 MG tablet Take 200 mg by mouth every 6 (six) hours as needed. pain   Yes Historical Provider, MD  levothyroxine (SYNTHROID, LEVOTHROID) 50 MCG tablet Take 50 mcg by mouth daily before breakfast.   Yes Historical Provider, MD  Probiotic Product (ALIGN PO) Take by mouth.   Yes Historical Provider, MD  GAVILYTE-G 236 G solution Take 4,000 mLs by mouth once.  09/16/13   Historical Provider, MD   Physical Exam: Blood pressure 127/56, pulse 87, temperature 98.1 F (36.7 C), temperature source Oral, resp. rate 19, height 5\' 3"  (1.6 m), weight 63.504 kg (140 lb), SpO2 98.00%. Filed Vitals:   09/30/13 1500  BP: 127/56  Pulse: 87  Temp:   Resp:      General:  Well-nourished no acute distress  Eyes: PE RRL, EOMI, no scleral icterus  ENT: Ears clear nose without drainage oropharynx without erythema or exudate. Mucous membranes of her mouth are pink but slightly dry  Neck: Supple no JVD full range of motion  Cardiovascular: Regular rate and rhythm no murmur no gallop no rub no lower extremity edema pedal pulses are present and palpable  Respiratory: Normal effort breath sounds are clear bilaterally no rhonchi or wheezes noted  Abdomen: Soft positive bowel sounds somewhat sluggish. Mild tenderness to palpation in the lower quadrants otherwise nontender  Skin: Warm and dry no rashes or lesions noted  Musculoskeletal: No clubbing no cyanosis joints without  swelling/erythema  Psychiatric: Calm cooperative  Neurologic: Cranial nerves II through XII grossly intact speech clear facial symmetry  Labs on Admission:  Basic Metabolic Panel:  Recent Labs Lab 09/30/13 1159  NA 139  K 3.8  CL 102  CO2 27  GLUCOSE 126*  BUN 11  CREATININE 0.81  CALCIUM 9.6   Liver Function Tests:  Recent Labs Lab 09/30/13 1159  AST 18  ALT 13  ALKPHOS 59  BILITOT 1.5*  PROT 8.1  ALBUMIN 4.0    Recent Labs Lab 09/30/13 1159  LIPASE 40   No results found for this basename: AMMONIA,  in the last 168 hours CBC:  Recent Labs Lab 09/30/13 1159  WBC 15.1*  NEUTROABS 13.6*  HGB 13.7  HCT 41.5  MCV 88.1  PLT 285   Cardiac Enzymes: No results found for this basename: CKTOTAL, CKMB, CKMBINDEX, TROPONINI,  in the last 168 hours BNP: No components found with this basename: POCBNP,  CBG: No results found for this basename: GLUCAP,  in the last 168 hours  Radiological Exams on Admission: Ct Abdomen Pelvis W Contrast  09/30/2013   CLINICAL DATA:  Nausea. Lower abdominal cramping with diarrhea. History of diverticulitis.  EXAM: CT ABDOMEN AND PELVIS WITH CONTRAST  TECHNIQUE: Multidetector CT imaging of  the abdomen and pelvis was performed using the standard protocol following bolus administration of intravenous contrast.  CONTRAST:  50mL OMNIPAQUE IOHEXOL 300 MG/ML SOLN, OMNIPAQUE IOHEXOL 300 MG/ML SOLN  COMPARISON:  05/08/2008  FINDINGS: Lower Chest: Minimal motion degradation. Similara 4 mm right lower lobe lung nodule on image 11, consistent with a benign etiology. Normal heart size without pericardial or pleural effusion.  Abdomen/Pelvis: Scattered hepatic cysts. Focal steatosis adjacent the falciform ligament. Splenule. Normal stomach, pancreas, gallbladder, biliary tract, adrenal glands.  An upper pole left renal cyst measures 9 mm.  Normal right kidney.  No retroperitoneal or retrocrural adenopathy.  Extensive colonic diverticulosis. Wall  thickening and pericolonic edema centered in the distal descending and proximal sigmoid colon. No surrounding abscess or free perforation.  Normal terminal ileum and appendix. Normal small bowel without abdominal ascites. No pelvic adenopathy. Normal urinary bladder and uterus. No adnexal mass. Trace cul-de-sac fluid which is likely secondary.  Bones/Musculoskeletal: No acute osseous abnormality. Scattered disc bulges, including at L3-4 and L4-5.  IMPRESSION: Uncomplicated distal descending and proximal sigmoid diverticulitis.   Electronically Signed   By: Jeronimo Greaves M.D.   On: 09/30/2013 15:09    EKG:   Time spent:   Gwenyth Bender [facility] Pager [phone]  If 7PM-7AM, please contact night-coverage [email or web address] Password [facility] 09/30/2013, 3:47 PM

## 2013-09-30 NOTE — ED Notes (Signed)
Pt c/o n/pain. Pt vomited small amount. Zofran and Morphine given.

## 2013-09-30 NOTE — ED Notes (Signed)
Lower abd cramping since last night. Nausea, dry heaves, diarrhea x 3. Denies black or bloody stools. Hx of diverticulitis. Nad. No s/s of pain in triage. Stable. Denies gu sx's states feels different than her diverticulitis pain.

## 2013-09-30 NOTE — ED Provider Notes (Signed)
CSN: [id number]     Arrival date & time 09/30/13  1056 History   First MD Initiated Contact with Patient 09/30/13 1301     Chief Complaint  Patient presents with  . Abdominal Pain    HPI Pt was seen at 1315.  Per pt, c/o gradual onset and persistence of constant generalized abd "pain" since yesterday.  Has been associated with multiple intermittent episodes of N/V/D.  Describes the abd pain as "cramping" and "I think it's my diverticulitis." Pt states she called her PMD today, and was sent to the ED for further evaluation. Describes the diarrhea as "watery." Denies fevers, no back pain, no rash, no CP/SOB, no black or blood in stools or emesis.      Past Medical History  Diagnosis Date  . Dyspnea   . Elevated lipids   . Chest pain   . Hypokalemia   . Diverticulitis   . Thyroid disease    Past Surgical History  Procedure Laterality Date  . Eye surgery      "Hole in eye".     History  Substance Use Topics  . Smoking status: Former Games developer  . Smokeless tobacco: Not on file     Comment: quit 5 yrs ago after smoking for 20 yrs.  . Alcohol Use: No    Review of Systems ROS: Statement: All systems negative except as marked or noted in the HPI; Constitutional: Negative for fever and chills. ; ; Eyes: Negative for eye pain, redness and discharge. ; ; ENMT: Negative for ear pain, hoarseness, nasal congestion, sinus pressure and sore throat. ; ; Cardiovascular: Negative for chest pain, palpitations, diaphoresis, dyspnea and peripheral edema. ; ; Respiratory: Negative for cough, wheezing and stridor. ; ; Gastrointestinal: +N/V/D, abd pain. Negative for blood in stool, hematemesis, jaundice and rectal bleeding. . ; ; Genitourinary: Negative for dysuria, flank pain and hematuria. ; ; Musculoskeletal: Negative for back pain and neck pain. Negative for swelling and trauma.; ; Skin: Negative for pruritus, rash, abrasions, blisters, bruising and skin lesion.; ; Neuro: Negative for headache,  lightheadedness and neck stiffness. Negative for weakness, altered level of consciousness , altered mental status, extremity weakness, paresthesias, involuntary movement, seizure and syncope.       Allergies  Review of patient's allergies indicates no known allergies.  Home Medications   Current Outpatient Rx  Name  Route  Sig  Dispense  Refill  . aspirin EC 81 MG tablet   Oral   Take 81 mg by mouth daily.         Marland Kitchen ibuprofen (ADVIL,MOTRIN) 200 MG tablet   Oral   Take 200 mg by mouth every 6 (six) hours as needed. pain         . levothyroxine (SYNTHROID, LEVOTHROID) 50 MCG tablet   Oral   Take 50 mcg by mouth daily before breakfast.         . Probiotic Product (ALIGN PO)   Oral   Take by mouth.         Marland Kitchen GAVILYTE-G 236 G solution   Oral   Take 4,000 mLs by mouth once.           BP 127/56  Pulse 87  Temp(Src) 98.1 F (36.7 C) (Oral)  Resp 19  Ht 5\' 3"  (1.6 m)  Wt 140 lb (63.504 kg)  BMI 24.81 kg/m2  SpO2 98% Physical Exam 1320: Physical examination:  Nursing notes reviewed; Vital signs and O2 SAT reviewed;  Constitutional: Well developed, Well nourished, Uncomfortable  appearing.; Head:  Normocephalic, atraumatic; Eyes: EOMI, PERRL, No scleral icterus; ENMT: Mouth and pharynx normal, Mucous membranes dry; Neck: Supple, Full range of motion, No lymphadenopathy; Cardiovascular: Regular rate and rhythm, No gallop; Respiratory: Breath sounds clear & equal bilaterally, No wheezes.  Speaking full sentences with ease, Normal respiratory effort/excursion; Chest: Nontender, Movement normal; Abdomen: Soft, +LUQ and LLQ tender to palp. Nondistended, Normal bowel sounds; Genitourinary: No CVA tenderness; Extremities: Pulses normal, No tenderness, No edema, No calf edema or asymmetry.; Neuro: AA&Ox3, Major CN grossly intact.  Speech clear. No gross focal motor or sensory deficits in extremities.; Skin: Color normal, Warm, Dry.   ED Course  Procedures     EKG  Interpretation   None       MDM  MDM Reviewed: previous chart, nursing note and vitals Reviewed previous: labs Interpretation: labs and CT scan   Results for orders placed during the hospital encounter of 09/30/13  CBC WITH DIFFERENTIAL      Result Value Range   WBC 15.1 (*) 4.0 - 10.5 K/uL   RBC 4.71  3.87 - 5.11 MIL/uL   Hemoglobin 13.7  12.0 - 15.0 g/dL   HCT 16.1  09.6 - 04.5 %   MCV 88.1  78.0 - 100.0 fL   MCH 29.1  26.0 - 34.0 pg   MCHC 33.0  30.0 - 36.0 g/dL   RDW 40.9  81.1 - 91.4 %   Platelets 285  150 - 400 K/uL   Neutrophils Relative % 90 (*) 43 - 77 %   Neutro Abs 13.6 (*) 1.7 - 7.7 K/uL   Lymphocytes Relative 6 (*) 12 - 46 %   Lymphs Abs 0.9  0.7 - 4.0 K/uL   Monocytes Relative 4  3 - 12 %   Monocytes Absolute 0.6  0.1 - 1.0 K/uL   Eosinophils Relative 0  0 - 5 %   Eosinophils Absolute 0.0  0.0 - 0.7 K/uL   Basophils Relative 0  0 - 1 %   Basophils Absolute 0.1  0.0 - 0.1 K/uL  BASIC METABOLIC PANEL      Result Value Range   Sodium 139  135 - 145 mEq/L   Potassium 3.8  3.5 - 5.1 mEq/L   Chloride 102  96 - 112 mEq/L   CO2 27  19 - 32 mEq/L   Glucose, Bld 126 (*) 70 - 99 mg/dL   BUN 11  6 - 23 mg/dL   Creatinine, Ser 7.82  0.50 - 1.10 mg/dL   Calcium 9.6  8.4 - 95.6 mg/dL   GFR calc non Af Amer 71 (*) >90 mL/min   GFR calc Af Amer 83 (*) >90 mL/min  URINALYSIS, ROUTINE W REFLEX MICROSCOPIC      Result Value Range   Color, Urine YELLOW  YELLOW   APPearance CLEAR  CLEAR   Specific Gravity, Urine >1.030 (*) 1.005 - 1.030   pH 5.5  5.0 - 8.0   Glucose, UA NEGATIVE  NEGATIVE mg/dL   Hgb urine dipstick MODERATE (*) NEGATIVE   Bilirubin Urine NEGATIVE  NEGATIVE   Ketones, ur NEGATIVE  NEGATIVE mg/dL   Protein, ur NEGATIVE  NEGATIVE mg/dL   Urobilinogen, UA 0.2  0.0 - 1.0 mg/dL   Nitrite NEGATIVE  NEGATIVE   Leukocytes, UA NEGATIVE  NEGATIVE  URINE MICROSCOPIC-ADD ON      Result Value Range   WBC, UA 0-2  <3 WBC/hpf   RBC / HPF TOO NUMEROUS TO COUNT   <3 RBC/hpf  Bacteria, UA FEW (*) RARE  HEPATIC FUNCTION PANEL      Result Value Range   Total Protein 8.1  6.0 - 8.3 g/dL   Albumin 4.0  3.5 - 5.2 g/dL   AST 18  0 - 37 U/L   ALT 13  0 - 35 U/L   Alkaline Phosphatase 59  39 - 117 U/L   Total Bilirubin 1.5 (*) 0.3 - 1.2 mg/dL   Bilirubin, Direct 0.2  0.0 - 0.3 mg/dL   Indirect Bilirubin 1.3 (*) 0.3 - 0.9 mg/dL  LIPASE, BLOOD      Result Value Range   Lipase 40  11 - 59 U/L   Ct Abdomen Pelvis W Contrast 09/30/2013   CLINICAL DATA:  Nausea. Lower abdominal cramping with diarrhea. History of diverticulitis.  EXAM: CT ABDOMEN AND PELVIS WITH CONTRAST  TECHNIQUE: Multidetector CT imaging of the abdomen and pelvis was performed using the standard protocol following bolus administration of intravenous contrast.  CONTRAST:  50mL OMNIPAQUE IOHEXOL 300 MG/ML SOLN, OMNIPAQUE IOHEXOL 300 MG/ML SOLN  COMPARISON:  05/08/2008  FINDINGS: Lower Chest: Minimal motion degradation. Similara 4 mm right lower lobe lung nodule on image 11, consistent with a benign etiology. Normal heart size without pericardial or pleural effusion.  Abdomen/Pelvis: Scattered hepatic cysts. Focal steatosis adjacent the falciform ligament. Splenule. Normal stomach, pancreas, gallbladder, biliary tract, adrenal glands.  An upper pole left renal cyst measures 9 mm.  Normal right kidney.  No retroperitoneal or retrocrural adenopathy.  Extensive colonic diverticulosis. Wall thickening and pericolonic edema centered in the distal descending and proximal sigmoid colon. No surrounding abscess or free perforation.  Normal terminal ileum and appendix. Normal small bowel without abdominal ascites. No pelvic adenopathy. Normal urinary bladder and uterus. No adnexal mass. Trace cul-de-sac fluid which is likely secondary.  Bones/Musculoskeletal: No acute osseous abnormality. Scattered disc bulges, including at L3-4 and L4-5.  IMPRESSION: Uncomplicated distal descending and proximal sigmoid  diverticulitis.   Electronically Signed   By: Jeronimo Greaves M.D.   On: 09/30/2013 15:09    1530:  Will start IV flagyl and cipro. Pt has vomited while in the ED, c/o pain and nausea; will medicate. Pt has not stooled while in the ED. Dx and testing d/w pt and family.  Questions answered.  Verb understanding, agreeable to observation admit. T/C to [facility]. Kerry Hough, case discussed, including:  HPI, pertinent PM/SHx, VS/PE, dx testing, ED course and treatment:  Agreeable to observation admit, requests to write temporary orders, obtain medical bed to team 1.      Laray Anger, DO 09/30/13 [phone]

## 2013-10-09 ENCOUNTER — Ambulatory Visit (HOSPITAL_COMMUNITY)
Admission: RE | Admit: 2013-10-09 | Payer: BC Managed Care – PPO | Source: Ambulatory Visit | Admitting: Internal Medicine

## 2013-10-09 ENCOUNTER — Encounter (HOSPITAL_COMMUNITY): Admission: RE | Payer: Self-pay | Source: Ambulatory Visit

## 2013-10-09 SURGERY — COLONOSCOPY
Anesthesia: Moderate Sedation

## 2014-02-26 ENCOUNTER — Other Ambulatory Visit (HOSPITAL_COMMUNITY): Payer: Self-pay | Admitting: Nurse Practitioner

## 2014-02-26 DIAGNOSIS — Z1231 Encounter for screening mammogram for malignant neoplasm of breast: Secondary | ICD-10-CM

## 2014-03-19 ENCOUNTER — Ambulatory Visit (HOSPITAL_COMMUNITY)
Admission: RE | Admit: 2014-03-19 | Discharge: 2014-03-19 | Disposition: A | Discharge status: Home / Self Care | Payer: BC Managed Care – PPO | Source: Ambulatory Visit | Attending: Nurse Practitioner | Admitting: Nurse Practitioner

## 2014-03-19 DIAGNOSIS — Z1231 Encounter for screening mammogram for malignant neoplasm of breast: Secondary | ICD-10-CM | POA: Insufficient documentation

## 2015-03-12 ENCOUNTER — Other Ambulatory Visit (HOSPITAL_COMMUNITY): Payer: Self-pay | Admitting: Nurse Practitioner

## 2015-03-12 DIAGNOSIS — Z1231 Encounter for screening mammogram for malignant neoplasm of breast: Secondary | ICD-10-CM

## 2015-04-01 ENCOUNTER — Ambulatory Visit (HOSPITAL_COMMUNITY)
Admission: RE | Admit: 2015-04-01 | Discharge: 2015-04-01 | Disposition: A | Discharge status: Home / Self Care | Payer: BC Managed Care – PPO | Source: Ambulatory Visit | Attending: Nurse Practitioner | Admitting: Nurse Practitioner

## 2015-04-01 DIAGNOSIS — Z1231 Encounter for screening mammogram for malignant neoplasm of breast: Secondary | ICD-10-CM | POA: Diagnosis not present

## 2016-03-07 ENCOUNTER — Other Ambulatory Visit: Payer: Self-pay

## 2016-03-07 DIAGNOSIS — Z1231 Encounter for screening mammogram for malignant neoplasm of breast: Secondary | ICD-10-CM

## 2016-04-04 ENCOUNTER — Ambulatory Visit
Admission: RE | Admit: 2016-04-04 | Discharge: 2016-04-04 | Disposition: A | Discharge status: Home / Self Care | Payer: Medicare Other | Source: Ambulatory Visit

## 2016-04-04 DIAGNOSIS — Z1231 Encounter for screening mammogram for malignant neoplasm of breast: Secondary | ICD-10-CM

## 2017-03-05 ENCOUNTER — Other Ambulatory Visit: Payer: Self-pay | Admitting: Family Medicine

## 2017-03-05 DIAGNOSIS — Z1231 Encounter for screening mammogram for malignant neoplasm of breast: Secondary | ICD-10-CM

## 2017-04-05 ENCOUNTER — Ambulatory Visit
Admission: RE | Admit: 2017-04-05 | Discharge: 2017-04-05 | Disposition: A | Discharge status: Home / Self Care | Payer: Medicare Other | Source: Ambulatory Visit | Attending: Family Medicine | Admitting: Family Medicine

## 2017-04-05 DIAGNOSIS — Z1231 Encounter for screening mammogram for malignant neoplasm of breast: Secondary | ICD-10-CM

## 2018-04-03 ENCOUNTER — Encounter (INDEPENDENT_AMBULATORY_CARE_PROVIDER_SITE_OTHER): Payer: Self-pay | Admitting: *Deleted

## 2018-04-05 ENCOUNTER — Other Ambulatory Visit: Payer: Self-pay | Admitting: Family Medicine

## 2018-04-05 DIAGNOSIS — Z1231 Encounter for screening mammogram for malignant neoplasm of breast: Secondary | ICD-10-CM

## 2018-04-26 ENCOUNTER — Ambulatory Visit
Admission: RE | Admit: 2018-04-26 | Discharge: 2018-04-26 | Disposition: A | Discharge status: Home / Self Care | Payer: Medicare Other | Source: Ambulatory Visit | Attending: Family Medicine | Admitting: Family Medicine

## 2018-04-26 DIAGNOSIS — Z1231 Encounter for screening mammogram for malignant neoplasm of breast: Secondary | ICD-10-CM

## 2019-03-21 ENCOUNTER — Other Ambulatory Visit: Payer: Self-pay | Admitting: Family Medicine

## 2019-03-21 DIAGNOSIS — Z1231 Encounter for screening mammogram for malignant neoplasm of breast: Secondary | ICD-10-CM

## 2019-05-13 ENCOUNTER — Ambulatory Visit
Admission: RE | Admit: 2019-05-13 | Discharge: 2019-05-13 | Disposition: A | Discharge status: Home / Self Care | Payer: Medicare Other | Source: Ambulatory Visit | Attending: Family Medicine | Admitting: Family Medicine

## 2019-05-13 DIAGNOSIS — Z1231 Encounter for screening mammogram for malignant neoplasm of breast: Secondary | ICD-10-CM

## 2020-03-03 DIAGNOSIS — Z0001 Encounter for general adult medical examination with abnormal findings: Secondary | ICD-10-CM | POA: Diagnosis not present

## 2020-03-03 DIAGNOSIS — Z6823 Body mass index (BMI) 23.0-23.9, adult: Secondary | ICD-10-CM | POA: Diagnosis not present

## 2020-03-03 DIAGNOSIS — E559 Vitamin D deficiency, unspecified: Secondary | ICD-10-CM | POA: Diagnosis not present

## 2020-03-03 DIAGNOSIS — E039 Hypothyroidism, unspecified: Secondary | ICD-10-CM | POA: Diagnosis not present

## 2020-04-07 ENCOUNTER — Other Ambulatory Visit: Payer: Self-pay | Admitting: Family Medicine

## 2020-04-07 DIAGNOSIS — Z1231 Encounter for screening mammogram for malignant neoplasm of breast: Secondary | ICD-10-CM

## 2020-04-28 DIAGNOSIS — H35341 Macular cyst, hole, or pseudohole, right eye: Secondary | ICD-10-CM | POA: Diagnosis not present

## 2020-05-19 ENCOUNTER — Other Ambulatory Visit: Payer: Self-pay

## 2020-05-19 ENCOUNTER — Ambulatory Visit
Admission: RE | Admit: 2020-05-19 | Discharge: 2020-05-19 | Disposition: A | Discharge status: Home / Self Care | Payer: Medicare PPO | Source: Ambulatory Visit | Attending: Family Medicine | Admitting: Family Medicine

## 2020-05-19 DIAGNOSIS — Z1231 Encounter for screening mammogram for malignant neoplasm of breast: Secondary | ICD-10-CM

## 2020-07-29 DIAGNOSIS — H26492 Other secondary cataract, left eye: Secondary | ICD-10-CM | POA: Diagnosis not present

## 2020-07-29 DIAGNOSIS — H35341 Macular cyst, hole, or pseudohole, right eye: Secondary | ICD-10-CM | POA: Diagnosis not present

## 2020-07-29 DIAGNOSIS — H01001 Unspecified blepharitis right upper eyelid: Secondary | ICD-10-CM | POA: Diagnosis not present

## 2020-07-29 DIAGNOSIS — Z961 Presence of intraocular lens: Secondary | ICD-10-CM | POA: Diagnosis not present

## 2020-08-30 DIAGNOSIS — Z23 Encounter for immunization: Secondary | ICD-10-CM | POA: Diagnosis not present

## 2021-03-04 DIAGNOSIS — K644 Residual hemorrhoidal skin tags: Secondary | ICD-10-CM | POA: Diagnosis not present

## 2021-03-04 DIAGNOSIS — Z1331 Encounter for screening for depression: Secondary | ICD-10-CM | POA: Diagnosis not present

## 2021-03-04 DIAGNOSIS — Z6824 Body mass index (BMI) 24.0-24.9, adult: Secondary | ICD-10-CM | POA: Diagnosis not present

## 2021-03-04 DIAGNOSIS — Z0000 Encounter for general adult medical examination without abnormal findings: Secondary | ICD-10-CM | POA: Diagnosis not present

## 2021-03-07 DIAGNOSIS — Z1389 Encounter for screening for other disorder: Secondary | ICD-10-CM | POA: Diagnosis not present

## 2021-03-07 DIAGNOSIS — K644 Residual hemorrhoidal skin tags: Secondary | ICD-10-CM | POA: Diagnosis not present

## 2021-03-07 DIAGNOSIS — Z6824 Body mass index (BMI) 24.0-24.9, adult: Secondary | ICD-10-CM | POA: Diagnosis not present

## 2021-03-07 DIAGNOSIS — E039 Hypothyroidism, unspecified: Secondary | ICD-10-CM | POA: Diagnosis not present

## 2021-03-29 ENCOUNTER — Encounter (INDEPENDENT_AMBULATORY_CARE_PROVIDER_SITE_OTHER): Payer: Self-pay | Admitting: *Deleted

## 2021-04-25 ENCOUNTER — Other Ambulatory Visit: Payer: Self-pay | Admitting: Family Medicine

## 2021-04-25 DIAGNOSIS — Z1231 Encounter for screening mammogram for malignant neoplasm of breast: Secondary | ICD-10-CM

## 2021-05-23 ENCOUNTER — Ambulatory Visit
Admission: RE | Admit: 2021-05-23 | Discharge: 2021-05-23 | Disposition: A | Discharge status: Home / Self Care | Payer: Medicare PPO | Source: Ambulatory Visit | Attending: Family Medicine | Admitting: Family Medicine

## 2021-05-23 ENCOUNTER — Other Ambulatory Visit: Payer: Self-pay

## 2021-05-23 DIAGNOSIS — Z1231 Encounter for screening mammogram for malignant neoplasm of breast: Secondary | ICD-10-CM

## 2021-07-12 DIAGNOSIS — R300 Dysuria: Secondary | ICD-10-CM | POA: Diagnosis not present

## 2021-07-20 DIAGNOSIS — K649 Unspecified hemorrhoids: Secondary | ICD-10-CM | POA: Diagnosis not present

## 2021-08-08 ENCOUNTER — Other Ambulatory Visit: Payer: Self-pay

## 2021-08-08 ENCOUNTER — Ambulatory Visit (INDEPENDENT_AMBULATORY_CARE_PROVIDER_SITE_OTHER): Payer: Medicare PPO | Admitting: Gastroenterology

## 2021-08-08 ENCOUNTER — Encounter (INDEPENDENT_AMBULATORY_CARE_PROVIDER_SITE_OTHER): Payer: Self-pay | Admitting: Gastroenterology

## 2021-08-08 DIAGNOSIS — L290 Pruritus ani: Secondary | ICD-10-CM | POA: Diagnosis not present

## 2021-08-08 DIAGNOSIS — K649 Unspecified hemorrhoids: Secondary | ICD-10-CM

## 2021-08-08 NOTE — Patient Instructions (Addendum)
Start taking Miralax 1 capful every day Can use Witch Hazel daily Please call back to schedule screening colonoscopy when ready

## 2021-08-08 NOTE — Progress Notes (Signed)
Angela Flowers, M.D. Gastroenterology & Hepatology [facility] [address] [place], [place] [address] Primary Care Physician: Angela Found, MD [address] [place] [address]  Referring MD: PCP  Chief Complaint: rectal itching and discomfort.  History of Present Illness: Angela Flowers is a 80 y.o. female with PMH hypothyroidism, HLD, who presents for evaluation of rectal itching and discomfort.  Patient reports that around April 2022 she started having recurrent sensation of burning and itching in the perianal area. She has not seen any fresh blood in her bowel movements. Has not noticed any dyschezia or straining. Stool is usually a [place] 4. She used Preparation H and hydrocortisone+lidocaine as prescribed by her PCP but it only improved the burning sensation for a short term.  She otherwise denies having any other symptom.  The patient denies having any nausea, vomiting, fever, chills, hematochezia, melena, hematemesis, abdominal distention, abdominal pain, diarrhea, jaundice, pruritus or weight loss.  Last EGD: never Last Colonoscopy: 2009 by Dr. Jarold Motto: average risk screening: Findings  - NORMAL EXAM - DIVERTICULOSIS: Splenic Flexure to Sigmoid Colon. Not bleeding.  ICD9: Diverticulosis, Colon: 562.10. Comments: Thickened red folds. - STRICTURE / STENOSIS: Descending Colon. Comments: Strictured  sigmoid area....dilated with the scope.  FHx: neg for any gastrointestinal/liver disease, no malignancies Social: neg smoking, alcohol or illicit drug use Surgical: no abdominal surgeries  Past Medical History: Past Medical History:  Diagnosis Date   Chest pain    Diverticulitis    Dyspnea    Elevated lipids    Hypokalemia    Thyroid disease     Past Surgical History: Past Surgical History:  Procedure Laterality Date   EYE SURGERY     "Hole in eye".     Family History: Family History  Problem  Relation Age of Onset   Breast cancer Neg Hx     Social History: Social History   Tobacco Use  Smoking Status Former  Smokeless Tobacco Never  Tobacco Comments   quit 5 yrs ago after smoking for 20 yrs.   Social History   Substance and Sexual Activity  Alcohol Use No   Social History   Substance and Sexual Activity  Drug Use No    Allergies: No Known Allergies  Medications: Current Outpatient Medications  Medication Sig Dispense Refill   levothyroxine (SYNTHROID, LEVOTHROID) 50 MCG tablet Take 50 mcg by mouth daily before breakfast.     OVER THE COUNTER MEDICATION Vit D 50 mcg once per day.     Probiotic Product (ALIGN PO) Take by mouth daily at 6 (six) AM.     No current facility-administered medications for this visit.    Review of Systems: GENERAL: negative for malaise, night sweats HEENT: No changes in hearing or vision, no nose bleeds or other nasal problems. NECK: Negative for lumps, goiter, pain and significant neck swelling RESPIRATORY: Negative for cough, wheezing CARDIOVASCULAR: Negative for chest pain, leg swelling, palpitations, orthopnea GI: SEE HPI MUSCULOSKELETAL: Negative for joint pain or swelling, back pain, and muscle pain. SKIN: Negative for lesions, rash PSYCH: Negative for sleep disturbance, mood disorder and recent psychosocial stressors. HEMATOLOGY Negative for prolonged bleeding, bruising easily, and swollen nodes. ENDOCRINE: Negative for cold or heat intolerance, polyuria, polydipsia and goiter. NEURO: negative for tremor, gait imbalance, syncope and seizures. The remainder of the review of systems is noncontributory.   Physical Exam: BP (!) 148/86 (BP Location: Left Arm, Patient Position: Sitting, Cuff Size: Small)   Pulse 76   Temp Marland Kitchen)  97.3 F (36.3 C) (Oral)   Ht 5\' 3"  (1.6 m)   Wt 134 lb 12.8 oz (61.1 kg)   BMI 23.88 kg/m  GENERAL: The patient is AO x3, in no acute distress. HEENT: Head is normocephalic and atraumatic. EOMI  are intact. Mouth is well hydrated and without lesions. NECK: Supple. No masses LUNGS: Clear to auscultation. No presence of rhonchi/wheezing/rales. Adequate chest expansion HEART: RRR, normal s1 and s2. ABDOMEN: Soft, nontender, no guarding, no peritoneal signs, and nondistended. BS +. No masses. RECTAL EXAM: presence of an internal hemorrhoid at 6 which is mildly engorged but not tender or bleeding, there is presence of a questionable mild rectal prolapse, normal tone, no masses, brown stool without blood. Chaperone: , CMA EXTREMITIES: Without any cyanosis, clubbing, rash, lesions or edema. NEUROLOGIC: AOx3, no focal motor deficit. SKIN: no jaundice, no rashes   Imaging/Labs: as above  I personally reviewed and interpreted the available labs, imaging and endoscopic files.  Impression and Plan: Angela Flowers is a 80 y.o. female with PMH hypothyroidism, HLD, who presents for evaluation of rectal itching and discomfort.  Patient has findings of the physical examination consistent with nonthrombosed hemorrhoids.  I had a thorough discussion with the patient regarding the need to loosen her bowel movements more to help the healing of these hemorrhoids.  She will also implement the use of sitz bath's and witch hazel to improve her symptoms.  She understood and agreed.  I explained to her that a surgical approach could be ultimately required but it can lead to significant discomfort in the perianal area and she would like to avoid this.  Finally, we will proceed with a screening colonoscopy after her symptoms have improved, she will call 70 back to let us know when she would like to proceed with this.  - Start taking Miralax 1 capful every day - Can use Witch Hazel daily - Pt to call back to schedule screening colonoscopy when ready  All questions were answered.      [place], MD Gastroenterology and Hepatology [facility]

## 2021-08-15 DIAGNOSIS — H353132 Nonexudative age-related macular degeneration, bilateral, intermediate dry stage: Secondary | ICD-10-CM | POA: Diagnosis not present

## 2021-09-07 ENCOUNTER — Other Ambulatory Visit (INDEPENDENT_AMBULATORY_CARE_PROVIDER_SITE_OTHER): Payer: Self-pay | Admitting: Gastroenterology

## 2021-09-07 ENCOUNTER — Telehealth (INDEPENDENT_AMBULATORY_CARE_PROVIDER_SITE_OTHER): Payer: Self-pay

## 2021-09-07 DIAGNOSIS — L290 Pruritus ani: Secondary | ICD-10-CM

## 2021-09-07 DIAGNOSIS — Z23 Encounter for immunization: Secondary | ICD-10-CM | POA: Diagnosis not present

## 2021-09-07 MED ORDER — CLOTRIMAZOLE 1 % EX CREA: 1.0000 "application " | TOPICAL_CREAM | Freq: Two times a day (BID) | CUTANEOUS | 1 refills | Status: AC

## 2021-09-07 MED ORDER — LIDOCAINE-HYDROCORTISONE ACE 3-0.5 % EX CREA: 1.0000 "application " | TOPICAL_CREAM | Freq: Every day | CUTANEOUS | 1 refills | Status: DC

## 2021-09-07 NOTE — Telephone Encounter (Signed)
Will send topical lidocaine/hydrocortisone compound and clotrimazole.  Patient understood and agreed.

## 2021-09-07 NOTE — Telephone Encounter (Signed)
Patient called this am stating she was told at her last visit to use Angela Flowers for her external hemorrhoids. This is not helping. She states she has also tried Hydrocortisone cream which does not help either.She would like for [place] to send in something to [facility] pharmacy for the burning and itching. Please advise.

## 2022-01-31 ENCOUNTER — Ambulatory Visit: Payer: Self-pay

## 2022-03-01 ENCOUNTER — Ambulatory Visit: Payer: Self-pay

## 2022-03-01 ENCOUNTER — Ambulatory Visit
Admission: EM | Admit: 2022-03-01 | Discharge: 2022-03-01 | Disposition: A | Discharge status: Home / Self Care | Payer: Medicare PPO | Attending: Family Medicine | Admitting: Family Medicine

## 2022-03-01 DIAGNOSIS — N390 Urinary tract infection, site not specified: Secondary | ICD-10-CM | POA: Insufficient documentation

## 2022-03-01 DIAGNOSIS — K644 Residual hemorrhoidal skin tags: Secondary | ICD-10-CM | POA: Diagnosis not present

## 2022-03-01 LAB — POCT URINALYSIS DIP (MANUAL ENTRY)
Bilirubin, UA: NEGATIVE
Glucose, UA: NEGATIVE mg/dL
Ketones, POC UA: NEGATIVE mg/dL
Nitrite, UA: NEGATIVE
Protein Ur, POC: 30 mg/dL — AB
Spec Grav, UA: 1.01 (ref 1.010–1.025)
Urobilinogen, UA: 0.2 E.U./dL
pH, UA: 6 (ref 5.0–8.0)

## 2022-03-01 MED ORDER — HYDROCORTISONE ACETATE 25 MG RE SUPP: 25.0000 mg | Freq: Two times a day (BID) | RECTAL | 0 refills | Status: DC

## 2022-03-01 MED ORDER — CEPHALEXIN 500 MG PO CAPS: 500.0000 mg | ORAL_CAPSULE | Freq: Two times a day (BID) | ORAL | 0 refills | Status: DC

## 2022-03-01 MED ORDER — HYDROCORTISONE (PERIANAL) 2.5 % EX CREA: 1.0000 "application " | TOPICAL_CREAM | Freq: Two times a day (BID) | CUTANEOUS | 0 refills | Status: DC

## 2022-03-01 NOTE — ED Triage Notes (Signed)
Pt states she has an external hemorrhoid and her lower back is hurting since Sunday ? ?Denies fever ?

## 2022-03-01 NOTE — ED Provider Notes (Signed)
?[facility] ? ? ? ?CSN: [id number] ?Arrival date & time: 03/01/22  1254 ? ? ?  ? ?History   ?Chief Complaint ?Chief Complaint  ?Patient presents with  ? UTI and back pain  ? ? ?HPI ?Angela Flowers is a 81 y.o. female.  ? ?Here today with several day history of worsening anal pain from a swollen external hemorrhoid.  She states this has been ongoing for months and is followed by GI for this, given a suppository but states it did not help.  Has been doing warm water soaks additionally.  Denies bleeding, constipation, straining, abdominal pain, nausea, vomiting.  Also having some lower back aching, burning with urination past few days.  Went to be checked for urinary tract infection. ? ? ?Past Medical History:  ?Diagnosis Date  ? Chest pain   ? Diverticulitis   ? Dyspnea   ? Elevated lipids   ? Hypokalemia   ? Thyroid disease   ? ? ?Patient Active Problem List  ? Diagnosis Date Noted  ? Pruritus ani 08/08/2021  ? Hemorrhoids 08/08/2021  ? Acute diverticulitis 09/30/2013  ? Leukocytosis 09/30/2013  ? CHEST PAIN-UNSPECIFIED 08/11/2009  ? ? ?Past Surgical History:  ?Procedure Laterality Date  ? EYE SURGERY    ? "Hole in eye".   ? ? ?OB History   ?No obstetric history on file. ?  ? ? ? ?Home Medications   ? ?Prior to Admission medications   ?Medication Sig Start Date End Date Taking? Authorizing Provider  ?cephALEXin (KEFLEX) 500 MG capsule Take 1 capsule (500 mg total) by mouth 2 (two) times daily. 03/01/22  Yes Particia Nearing, PA-C  ?hydrocortisone (ANUSOL-HC) 2.5 % rectal cream Place 1 application. rectally 2 (two) times daily. 03/01/22  Yes Particia Nearing, PA-C  ?hydrocortisone (ANUSOL-HC) 25 MG suppository Place 1 suppository (25 mg total) rectally 2 (two) times daily. 03/01/22  Yes Particia Nearing, PA-C  ?levothyroxine (SYNTHROID, LEVOTHROID) 50 MCG tablet Take 50 mcg by mouth daily before breakfast.    [provider]  ?Lidocaine-Hydrocortisone Ace 3-0.5 % CREA Apply 1  application topically daily. 09/07/21   Dolores Frame, MD  ?OVER THE COUNTER MEDICATION Vit D 50 mcg once per day.    [provider]  ?Probiotic Product (ALIGN PO) Take by mouth daily at 6 (six) AM.    [provider]  ? ? ?Family History ?Family History  ?Problem Relation Age of Onset  ? Breast cancer Neg Hx   ? ? ?Social History ?Social History  ? ?Tobacco Use  ? Smoking status: Former  ?  Passive exposure: Never  ? Smokeless tobacco: Never  ? Tobacco comments:  ?  quit 5 yrs ago after smoking for 20 yrs.  ?Vaping Use  ? Vaping Use: Never used  ?Substance Use Topics  ? Alcohol use: No  ? Drug use: No  ? ? ? ?Allergies   ?Patient has no known allergies. ? ? ?Review of Systems ?Review of Systems ?Per HPI ? ?Physical Exam ?Triage Vital Signs ?ED Triage Vitals  ?Enc Vitals Group  ?   BP 03/01/22 1259 127/81  ?   Pulse Rate 03/01/22 1259 91  ?   Resp 03/01/22 1259 16  ?   Temp 03/01/22 1259 (!) 97.5 ?F (36.4 ?C)  ?   Temp Source 03/01/22 1259 Oral  ?   SpO2 03/01/22 1259 98 %  ?   Weight --   ?   Height --   ?  Head Circumference --   ?   Peak Flow --   ?   Pain Score 03/01/22 1301 8  ?   Pain Loc --   ?   Pain Edu? --   ?   Excl. in GC? --   ? ?No data found. ? ?Updated Vital Signs ?BP 127/81 (BP Location: Right Arm)   Pulse 91   Temp (!) 97.5 ?F (36.4 ?C) (Oral)   Resp 16   SpO2 98%  ? ?Visual Acuity ?Right Eye Distance:   ?Left Eye Distance:   ?Bilateral Distance:   ? ?Right Eye Near:   ?Left Eye Near:    ?Bilateral Near:    ? ?Physical Exam ?Vitals and nursing note reviewed. Exam conducted with a chaperone present.  ?Constitutional:   ?   Appearance: Normal appearance. She is not ill-appearing.  ?HENT:  ?   Head: Atraumatic.  ?Eyes:  ?   Extraocular Movements: Extraocular movements intact.  ?   Conjunctiva/sclera: Conjunctivae normal.  ?Cardiovascular:  ?   Rate and Rhythm: Normal rate and regular rhythm.  ?   Heart sounds: Normal heart sounds.  ?Pulmonary:  ?   Effort: Pulmonary  effort is normal.  ?   Breath sounds: Normal breath sounds.  ?Abdominal:  ?   General: Bowel sounds are normal. There is no distension.  ?   Palpations: Abdomen is soft.  ?   Tenderness: There is no abdominal tenderness. There is no right CVA tenderness, left CVA tenderness or guarding.  ?Genitourinary: ?   Comments: Inflamed external hemorrhoids, nonthrombosed, no bleeding ?Musculoskeletal:     ?   General: Normal range of motion.  ?   Cervical back: Normal range of motion and neck supple.  ?Skin: ?   General: Skin is warm and dry.  ?Neurological:  ?   Mental Status: She is alert and oriented to person, place, and time.  ?Psychiatric:     ?   Mood and Affect: Mood normal.     ?   Thought Content: Thought content normal.     ?   Judgment: Judgment normal.  ? ? ? ?UC Treatments / Results  ?Labs ?(all labs ordered are listed, but only abnormal results are displayed) ?Labs Reviewed  ?POCT URINALYSIS DIP (MANUAL ENTRY) - Abnormal; Notable for the following components:  ?    Result Value  ? Blood, UA moderate (*)   ? Protein Ur, POC =30 (*)   ? Leukocytes, UA Small (1+) (*)   ? All other components within normal limits  ?URINE CULTURE  ? ? ?EKG ? ? ?Radiology ?No results found. ? ?Procedures ?Procedures (including critical care time) ? ?Medications Ordered in UC ?Medications - No data to display ? ?Initial Impression / Assessment and Plan / UC Course  ?I have reviewed the triage vital signs and the nursing notes. ? ?Pertinent labs & imaging results that were available during my care of the patient were reviewed by me and considered in my medical decision making (see chart for details). ? ?  ? ?Treat inflamed hemorrhoid with Anusol cream and suppositories, warm Epsom salt soaks, good bowel regimen.  Follow-up with GI specialist if not fully resolving.  Urinalysis today showing possible urinary tract infection.  Treat with Keflex while awaiting urine culture.  Return for worsening symptoms. ? ?Final Clinical Impressions(s)  / UC Diagnoses  ? ?Final diagnoses:  ?Acute lower UTI  ?Inflamed external hemorrhoid  ? ?Discharge Instructions   ?None ?  ? ?ED Prescriptions   ? ?  Medication Sig Dispense Auth. Provider  ? hydrocortisone (ANUSOL-HC) 2.5 % rectal cream Place 1 application. rectally 2 (two) times daily. 60 g Particia Nearing, [place]  ? hydrocortisone (ANUSOL-HC) 25 MG suppository Place 1 suppository (25 mg total) rectally 2 (two) times daily. 20 suppository Particia Nearing, [place]  ? cephALEXin (KEFLEX) 500 MG capsule Take 1 capsule (500 mg total) by mouth 2 (two) times daily. 10 capsule Particia Nearing, [place]  ? ?  ? ?PDMP not reviewed this encounter. ?  ?Particia Nearing, PA-C ?03/01/22 1413 ? ?

## 2022-03-03 LAB — URINE CULTURE: Culture: <10000 — AB

## 2022-03-04 ENCOUNTER — Telehealth: Payer: Self-pay | Admitting: *Deleted

## 2022-03-04 NOTE — Telephone Encounter (Signed)
A return call to Pt concerning results of Urine culture. Pt report she has back pain . Pt instructed to continue Keflex till finished. Pt also instructed to decrease caffeine intake and increase water intake. Pt should return for recheck if Sx's have not improved after completing keflex for recheck. Pt voiced understanding. ?

## 2022-03-06 ENCOUNTER — Ambulatory Visit: Admission: EM | Admit: 2022-03-06 | Discharge: 2022-03-06 | Discharge status: Absent without leave | Payer: Medicare PPO

## 2022-03-07 ENCOUNTER — Ambulatory Visit (INDEPENDENT_AMBULATORY_CARE_PROVIDER_SITE_OTHER): Payer: Medicare PPO

## 2022-03-07 ENCOUNTER — Ambulatory Visit
Admission: EM | Admit: 2022-03-07 | Discharge: 2022-03-07 | Disposition: A | Discharge status: Home / Self Care | Payer: Medicare PPO | Attending: Family Medicine | Admitting: Family Medicine

## 2022-03-07 DIAGNOSIS — S39012A Strain of muscle, fascia and tendon of lower back, initial encounter: Secondary | ICD-10-CM

## 2022-03-07 DIAGNOSIS — M545 Low back pain, unspecified: Secondary | ICD-10-CM | POA: Diagnosis not present

## 2022-03-07 MED ORDER — NAPROXEN 500 MG PO TABS: 500.0000 mg | ORAL_TABLET | Freq: Two times a day (BID) | ORAL | 0 refills | Status: AC | PRN

## 2022-03-07 MED ORDER — CYCLOBENZAPRINE HCL 5 MG PO TABS: 5.0000 mg | ORAL_TABLET | Freq: Two times a day (BID) | ORAL | 0 refills | Status: DC | PRN

## 2022-03-07 NOTE — ED Provider Notes (Signed)
?[facility] ? ? ? ?CSN: [id number] ?Arrival date & time: 03/07/22  1153 ? ? ?  ? ?History   ?Chief Complaint ?Chief Complaint  ?Patient presents with  ? Back Pain  ?  Low back pain   ? ? ?HPI ?Angela Flowers is a 81 y.o. female.  ? ?Presenting today with about a week of soreness, stiffness in her low back bilaterally worse with movement.  States she was doing a bunch of yard work outside, but no known injury to the area.  Denies radiation of pain down legs, numbness, tingling, weakness, bowel or bladder incontinence, saddle anesthesia.  Has not been trying anything for symptoms. ? ? ?Past Medical History:  ?Diagnosis Date  ? Chest pain   ? Diverticulitis   ? Dyspnea   ? Elevated lipids   ? Hypokalemia   ? Thyroid disease   ? ? ?Patient Active Problem List  ? Diagnosis Date Noted  ? Pruritus ani 08/08/2021  ? Hemorrhoids 08/08/2021  ? Acute diverticulitis 09/30/2013  ? Leukocytosis 09/30/2013  ? CHEST PAIN-UNSPECIFIED 08/11/2009  ? ? ?Past Surgical History:  ?Procedure Laterality Date  ? EYE SURGERY    ? "Hole in eye".   ? ? ?OB History   ?No obstetric history on file. ?  ? ? ? ?Home Medications   ? ?Prior to Admission medications   ?Medication Sig Start Date End Date Taking? Authorizing Provider  ?cyclobenzaprine (FLEXERIL) 5 MG tablet Take 1 tablet (5 mg total) by mouth 2 (two) times daily as needed for muscle spasms. Do not drink alcohol or drive while taking this medication, may cause drowsiness 03/07/22  Yes Particia Nearing, PA-C  ?naproxen (NAPROSYN) 500 MG tablet Take 1 tablet (500 mg total) by mouth 2 (two) times daily as needed. 03/07/22  Yes Particia Nearing, PA-C  ?cephALEXin (KEFLEX) 500 MG capsule Take 1 capsule (500 mg total) by mouth 2 (two) times daily. 03/01/22   Particia Nearing, PA-C  ?hydrocortisone (ANUSOL-HC) 2.5 % rectal cream Place 1 application. rectally 2 (two) times daily. 03/01/22   Particia Nearing, PA-C  ?hydrocortisone (ANUSOL-HC) 25 MG suppository Place  1 suppository (25 mg total) rectally 2 (two) times daily. 03/01/22   Particia Nearing, PA-C  ?levothyroxine (SYNTHROID, LEVOTHROID) 50 MCG tablet Take 50 mcg by mouth daily before breakfast.    [provider]  ?Lidocaine-Hydrocortisone Ace 3-0.5 % CREA Apply 1 application topically daily. 09/07/21   Dolores Frame, MD  ?OVER THE COUNTER MEDICATION Vit D 50 mcg once per day.    [provider]  ?Probiotic Product (ALIGN PO) Take by mouth daily at 6 (six) AM.    [provider]  ? ? ?Family History ?Family History  ?Problem Relation Age of Onset  ? Breast cancer Neg Hx   ? ? ?Social History ?Social History  ? ?Tobacco Use  ? Smoking status: Former  ?  Passive exposure: Never  ? Smokeless tobacco: Never  ? Tobacco comments:  ?  quit 5 yrs ago after smoking for 20 yrs.  ?Vaping Use  ? Vaping Use: Never used  ?Substance Use Topics  ? Alcohol use: No  ? Drug use: No  ? ? ? ?Allergies   ?Patient has no known allergies. ? ? ?Review of Systems ?Review of Systems ?Per HPI ? ?Physical Exam ?Triage Vital Signs ?ED Triage Vitals  ?Enc Vitals Group  ?   BP 03/07/22 1225 (!) 145/85  ?   Pulse Rate 03/07/22 1225  73  ?   Resp 03/07/22 1225 16  ?   Temp 03/07/22 1225 98 ?F (36.7 ?C)  ?   Temp Source 03/07/22 1225 Oral  ?   SpO2 03/07/22 1225 97 %  ?   Weight --   ?   Height --   ?   Head Circumference --   ?   Peak Flow --   ?   Pain Score 03/07/22 1223 10  ?   Pain Loc --   ?   Pain Edu? --   ?   Excl. in GC? --   ? ?No data found. ? ?Updated Vital Signs ?BP (!) 145/85 (BP Location: Right Arm)   Pulse 73   Temp 98 ?F (36.7 ?C) (Oral)   Resp 16   SpO2 97%  ? ?Visual Acuity ?Right Eye Distance:   ?Left Eye Distance:   ?Bilateral Distance:   ? ?Right Eye Near:   ?Left Eye Near:    ?Bilateral Near:    ? ?Physical Exam ?Vitals and nursing note reviewed.  ?Constitutional:   ?   Appearance: Normal appearance. She is not ill-appearing.  ?HENT:  ?   Head: Atraumatic.  ?   Mouth/Throat:  ?    Mouth: Mucous membranes are moist.  ?Eyes:  ?   Extraocular Movements: Extraocular movements intact.  ?   Conjunctiva/sclera: Conjunctivae normal.  ?Cardiovascular:  ?   Rate and Rhythm: Normal rate and regular rhythm.  ?   Heart sounds: Normal heart sounds.  ?Pulmonary:  ?   Effort: Pulmonary effort is normal.  ?   Breath sounds: Normal breath sounds.  ?Abdominal:  ?   General: Bowel sounds are normal. There is no distension.  ?   Palpations: Abdomen is soft.  ?   Tenderness: There is no abdominal tenderness. There is no guarding.  ?Musculoskeletal:     ?   General: Tenderness present. No swelling, deformity or signs of injury. Normal range of motion.  ?   Cervical back: Normal range of motion and neck supple.  ?   Comments: No midline spinal tenderness to palpation diffusely.  Bilateral lumbar paraspinal muscles tender to palpation and mild spasm.  Range of motion full and intact.  Negative straight leg raise bilaterally.  Normal gait  ?Skin: ?   General: Skin is warm and dry.  ?Neurological:  ?   Mental Status: She is alert and oriented to person, place, and time.  ?   Comments: Bilateral lower extremities neurovascularly intact  ?Psychiatric:     ?   Mood and Affect: Mood normal.     ?   Thought Content: Thought content normal.     ?   Judgment: Judgment normal.  ? ? ? ?UC Treatments / Results  ?Labs ?(all labs ordered are listed, but only abnormal results are displayed) ?Labs Reviewed - No data to display ? ?EKG ? ? ?Radiology ?DG Lumbar Spine Complete ? ?Result Date: 03/07/2022 ?CLINICAL DATA:  Lower back pain.  No known injury. EXAM: LUMBAR SPINE - COMPLETE 4+ VIEW COMPARISON:  CT abdomen and pelvis 09/30/2013 FINDINGS: Mildly decreased bone mineralization. Normal frontal alignment. Minimal 3 mm retrolisthesis of L4 on L5, likely degenerative. Vertebral body heights are maintained. Mild posterior L5-S1 disc space narrowing. Moderate L5-S1 facet joint arthropathy. Mild anterior L1-2 through L5-S1 endplate  spurring. IMPRESSION:: IMPRESSION: 1. Minimal retrolisthesis of L4 on L5 likely degenerative. 2. Mild posterior L5-S1 disc space narrowing. 3. Moderate L5-S1 facet joint arthropathy. Electronically Signed  By: Neita Garnet M.D.   On: 03/07/2022 13:02   ? ?Procedures ?Procedures (including critical care time) ? ?Medications Ordered in UC ?Medications - No data to display ? ?Initial Impression / Assessment and Plan / UC Course  ?I have reviewed the triage vital signs and the nursing notes. ? ?Pertinent labs & imaging results that were available during my care of the patient were reviewed by me and considered in my medical decision making (see chart for details). ? ?  ? ? ?Patient requesting an x-ray of her low back, this was negative for acute bony abnormality and showing chronic arthritic changes.  Suspect lumbar strain of paraspinal muscles.  Treat with very low-dose Flexeril at bedtime, naproxen as needed, heat, rest, massages.  Return for acutely worsening symptoms. ? ?Final Clinical Impressions(s) / UC Diagnoses  ? ?Final diagnoses:  ?Strain of lumbar region, initial encounter  ? ?Discharge Instructions   ?None ?  ? ?ED Prescriptions   ? ? Medication Sig Dispense Auth. Provider  ? naproxen (NAPROSYN) 500 MG tablet Take 1 tablet (500 mg total) by mouth 2 (two) times daily as needed. 30 tablet Particia Nearing, [place]  ? cyclobenzaprine (FLEXERIL) 5 MG tablet Take 1 tablet (5 mg total) by mouth 2 (two) times daily as needed for muscle spasms. Do not drink alcohol or drive while taking this medication, may cause drowsiness 10 tablet Particia Nearing, [place]  ? ?  ? ?PDMP not reviewed this encounter. ?  ?Particia Nearing, PA-C ?03/07/22 1511 ? ?

## 2022-03-07 NOTE — ED Triage Notes (Addendum)
Pt states her lower back is still hurting from last week ? ?Denies Injury ? ? ?Denies Meds ? ? ?

## 2022-03-21 DIAGNOSIS — I10 Essential (primary) hypertension: Secondary | ICD-10-CM | POA: Diagnosis not present

## 2022-03-21 DIAGNOSIS — Z1331 Encounter for screening for depression: Secondary | ICD-10-CM | POA: Diagnosis not present

## 2022-03-21 DIAGNOSIS — N390 Urinary tract infection, site not specified: Secondary | ICD-10-CM | POA: Diagnosis not present

## 2022-03-21 DIAGNOSIS — Z0001 Encounter for general adult medical examination with abnormal findings: Secondary | ICD-10-CM | POA: Diagnosis not present

## 2022-03-21 DIAGNOSIS — K621 Rectal polyp: Secondary | ICD-10-CM | POA: Diagnosis not present

## 2022-03-21 DIAGNOSIS — K620 Anal polyp: Secondary | ICD-10-CM | POA: Diagnosis not present

## 2022-03-21 DIAGNOSIS — Z6823 Body mass index (BMI) 23.0-23.9, adult: Secondary | ICD-10-CM | POA: Diagnosis not present

## 2022-04-12 ENCOUNTER — Other Ambulatory Visit: Payer: Self-pay | Admitting: Family Medicine

## 2022-04-12 DIAGNOSIS — Z1231 Encounter for screening mammogram for malignant neoplasm of breast: Secondary | ICD-10-CM

## 2022-05-24 ENCOUNTER — Ambulatory Visit
Admission: RE | Admit: 2022-05-24 | Discharge: 2022-05-24 | Disposition: A | Discharge status: Home / Self Care | Payer: Medicare PPO | Source: Ambulatory Visit | Attending: Family Medicine | Admitting: Family Medicine

## 2022-05-24 DIAGNOSIS — Z1231 Encounter for screening mammogram for malignant neoplasm of breast: Secondary | ICD-10-CM | POA: Diagnosis not present

## 2022-05-31 IMAGING — DX DG LUMBAR SPINE COMPLETE 4+V
5 series · 5 of 5 positions shown · non-contrast
Comparison: CT abdomen and pelvis 09/30/2013

CLINICAL DATA: Lower back pain.  No known injury.

EXAM:
LUMBAR SPINE - COMPLETE 4+ VIEW

[lumbar spine ap]
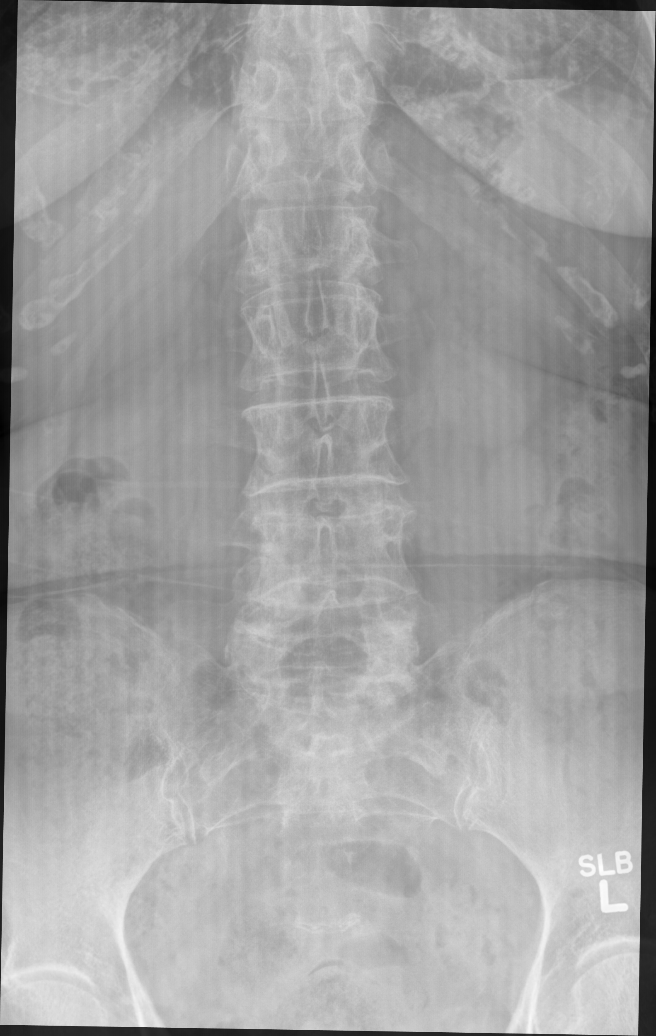

[lumbar spine mlo (1 of 2)]
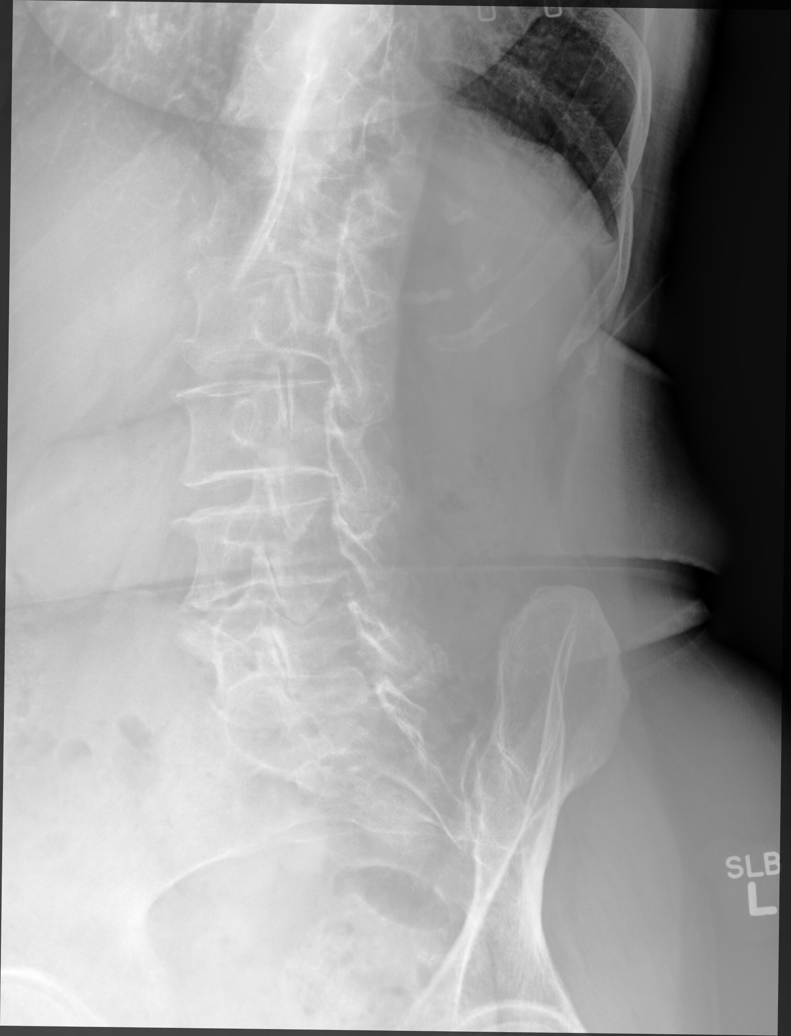

[lumbar spine mlo (2 of 2)]
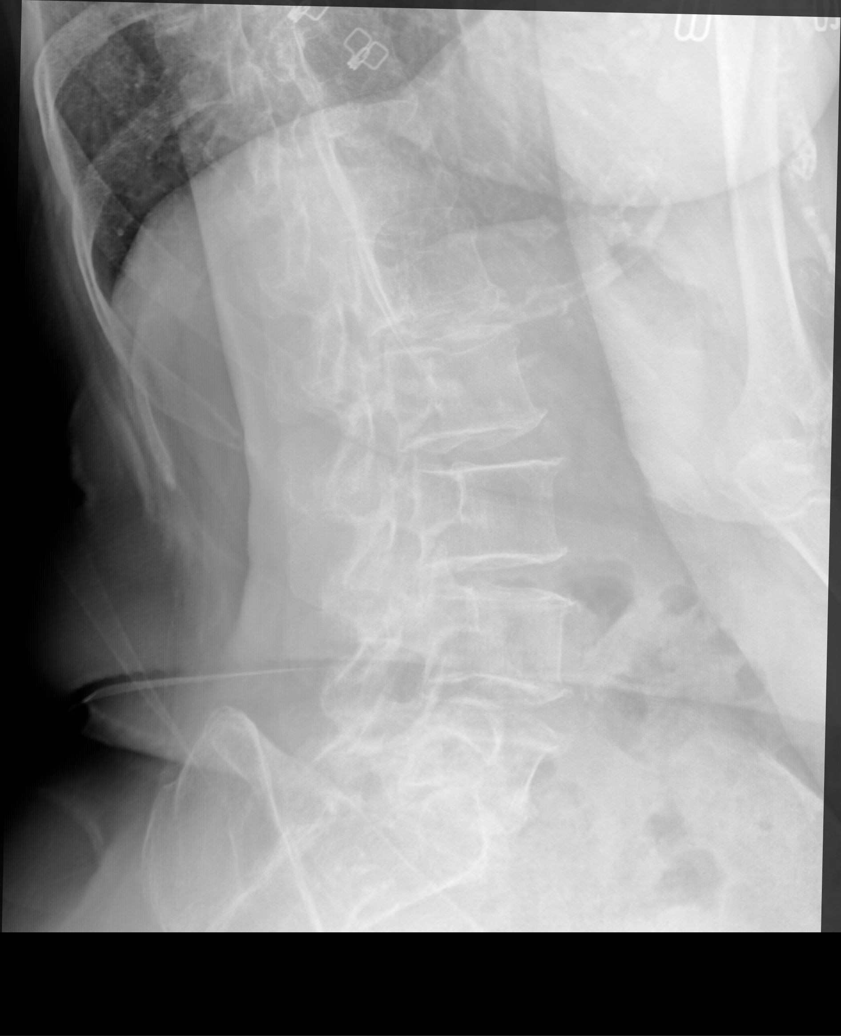

[lumbar spine lat (1 of 2)]
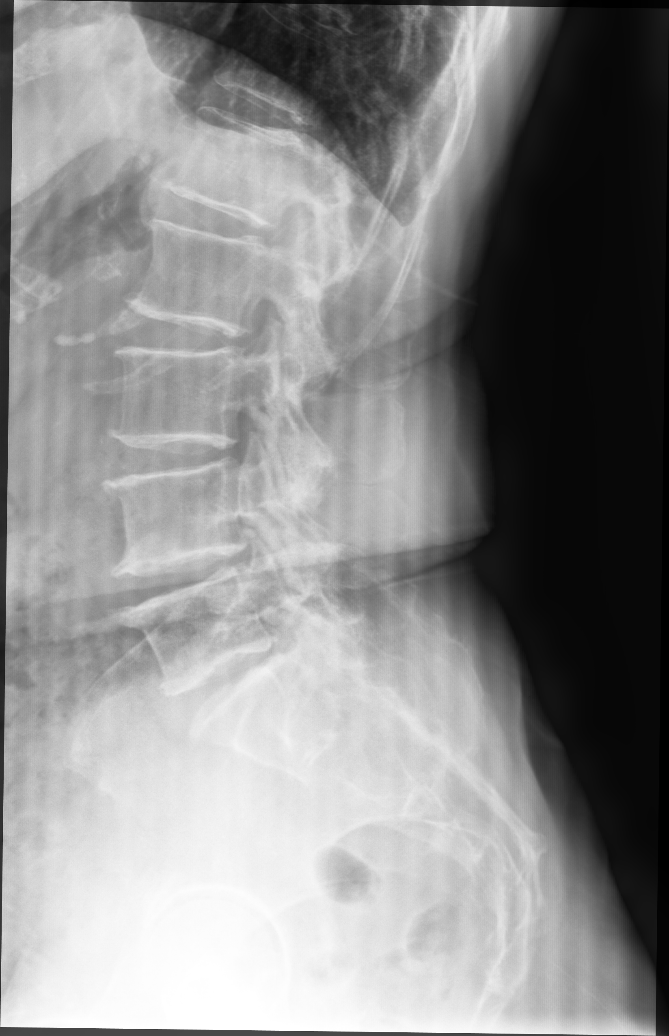

[lumbar spine lat (2 of 2)]
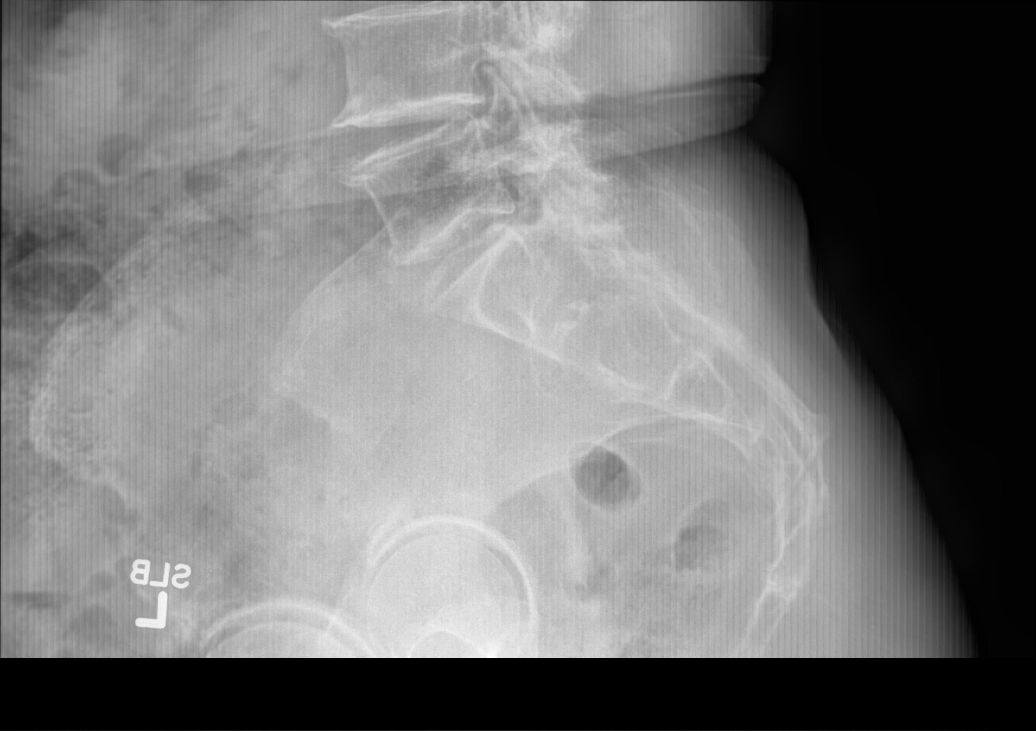

[5 of 5 positions shown; findings below may reference images not displayed]

FINDINGS: Mildly decreased bone mineralization. Normal frontal alignment.
Minimal 3 mm retrolisthesis of L4 on L5, likely degenerative.
Vertebral body heights are maintained. Mild posterior L5-S1 disc
space narrowing.

Moderate L5-S1 facet joint arthropathy. Mild anterior L1-2 through
L5-S1 endplate spurring.
IMPRESSION: :
IMPRESSION: 1. Minimal retrolisthesis of L4 on L5 likely degenerative.
2. Mild posterior L5-S1 disc space narrowing.
3. Moderate L5-S1 facet joint arthropathy.

## 2022-08-16 DIAGNOSIS — H353132 Nonexudative age-related macular degeneration, bilateral, intermediate dry stage: Secondary | ICD-10-CM | POA: Diagnosis not present

## 2022-12-27 ENCOUNTER — Telehealth: Payer: Self-pay | Admitting: *Deleted

## 2022-12-27 NOTE — Progress Notes (Signed)
  Care Coordination  Outreach Note  12/27/2022 Name: Sumaya Gorter MRN: [id number] DOB: 05-27-1941   Care Coordination Outreach Attempts: An unsuccessful telephone outreach was attempted today to offer the patient information about available care coordination services as a benefit of their health plan.   Follow Up Plan:  Additional outreach attempts will be made to offer the patient care coordination information and services.   Encounter Outcome:  No Answer  [place]  Direct Dial: [phone]

## 2023-01-02 NOTE — Progress Notes (Signed)
  Care Coordination  Outreach Note  01/02/2023 Name: Angela Flowers MRN: [id number] DOB: 03/19/1941   Care Coordination Outreach Attempts: A second unsuccessful outreach was attempted today to offer the patient with information about available care coordination services as a benefit of their health plan.     Follow Up Plan:  Additional outreach attempts will be made to offer the patient care coordination information and services.   Encounter Outcome:  No Answer  [place]  Direct Dial: [phone]

## 2023-01-05 NOTE — Progress Notes (Signed)
  Care Coordination   Note   01/05/2023 Name: [name] MRN: [id number] DOB: 12/31/1940  Angela Flowers is a 82 y.o. year old female who sees Sharilyn Sites, MD for primary care. I reached out to Raford Pitcher by phone today to offer care coordination services.  Ms. Oberlander was given information about Care Coordination services today including:   The Care Coordination services include support from the care team which includes your Nurse Coordinator, Clinical Social Worker, or Pharmacist.  The Care Coordination team is here to help remove barriers to the health concerns and goals most important to you. Care Coordination services are voluntary, and the patient may decline or stop services at any time by request to their care team member.   Care Coordination Consent Status: Patient agreed to services and verbal consent obtained.   Follow up plan:  Telephone appointment with care coordination team member scheduled for:  01/22/23  Encounter Outcome:  Pt. Scheduled  [place]  Direct Dial: [phone]

## 2023-01-22 ENCOUNTER — Ambulatory Visit: Payer: Self-pay | Admitting: *Deleted

## 2023-01-22 ENCOUNTER — Encounter: Payer: Self-pay | Admitting: *Deleted

## 2023-01-22 NOTE — Patient Outreach (Signed)
  Care Coordination   Initial Visit Note   01/22/2023 Name: Angela Flowers MRN: [id number] DOB: 1941/02/23  Angela Flowers is a 82 y.o. year old female who sees Angela Sites, MD for primary care. I spoke with  Angela Flowers by phone today.  What matters to the patients health and wellness today?  Management of hemorrhoids. No other needs at this time.     Goals Addressed             This Visit's Progress    COMPLETED: Care Coordination Services (No Follow-up Required)       Care Coordination Goals: Patient will follow-up with PCP or surgeon regarding hemorrhoids Patient will use OTC hemorrhoidal cream or ointment. Can use one with lidocaine as directed. May be especially useful prior to bowel movement if she can apply it about 5 to 10 minutes prior to BM. Patient will use warm water/epsom salt sitz bath Patient will use witch hazel to gently clean anal area after a bowel movement Patient will report any new or worsening symptoms to provider Patient will discuss potential differential diagnosis, like anal fissure, with provider given the burning pain she describes Patient will consider treatment options and discuss with provider Patient will reach out to RN Care Coordinator at [phone] with any future care coordination or resource needs          SDOH assessments and interventions completed:  Yes  SDOH Interventions Today    Flowsheet Row Most Recent Value  SDOH Interventions   Food Insecurity Interventions Intervention Not Indicated  Housing Interventions Intervention Not Indicated  Transportation Interventions Intervention Not Indicated  Utilities Interventions Intervention Not Indicated  Financial Strain Interventions Intervention Not Indicated        Care Coordination Interventions:  Yes, provided  Interventions Today    Flowsheet Row Most Recent Value  Chronic Disease   Chronic disease during today's visit Other  [Hemorroidal Pain]  General  Interventions   General Interventions Discussed/Reviewed General Interventions Discussed, General Interventions Reviewed, Doctor Visits  [provided information on [place]. Patient denies need for care coordination or resource assistance at this time.]  Doctor Visits Discussed/Reviewed Doctor Visits Reviewed, PCP, Specialist  [follow-up with surgeon if needed for hemorrhoids]  PCP/Specialist Visits Compliance with follow-up visit  Education Interventions   Education Provided Provided Education  Provided Verbal Education On Nutrition, When to see the doctor, Mental Health/Coping with Illness  [avoiding constipation and straining. OTC hemorrhoid management. Potential treamtent options]  Nutrition Interventions   Nutrition Discussed/Reviewed Nutrition Discussed, Nutrition Reviewed, Adding fruits and vegetables, Fluid intake  Pharmacy Interventions   Pharmacy Dicussed/Reviewed Medications and their functions       Follow up plan: No further intervention required. Patient declined f/u.  Encounter Outcome:  Pt. Visit Completed   Chong Sicilian, BSN, RN-BC RN Care Coordinator [place] Direct Dial: [phone] Main #: [phone]

## 2023-04-04 DIAGNOSIS — Z0001 Encounter for general adult medical examination with abnormal findings: Secondary | ICD-10-CM | POA: Diagnosis not present

## 2023-04-04 DIAGNOSIS — N390 Urinary tract infection, site not specified: Secondary | ICD-10-CM | POA: Diagnosis not present

## 2023-04-04 DIAGNOSIS — E559 Vitamin D deficiency, unspecified: Secondary | ICD-10-CM | POA: Diagnosis not present

## 2023-04-04 DIAGNOSIS — Z6823 Body mass index (BMI) 23.0-23.9, adult: Secondary | ICD-10-CM | POA: Diagnosis not present

## 2023-04-04 DIAGNOSIS — D518 Other vitamin B12 deficiency anemias: Secondary | ICD-10-CM | POA: Diagnosis not present

## 2023-04-04 DIAGNOSIS — E039 Hypothyroidism, unspecified: Secondary | ICD-10-CM | POA: Diagnosis not present

## 2023-04-04 DIAGNOSIS — Z1331 Encounter for screening for depression: Secondary | ICD-10-CM | POA: Diagnosis not present

## 2023-04-04 DIAGNOSIS — G9332 Myalgic encephalomyelitis/chronic fatigue syndrome: Secondary | ICD-10-CM | POA: Diagnosis not present

## 2023-04-12 DIAGNOSIS — Z1211 Encounter for screening for malignant neoplasm of colon: Secondary | ICD-10-CM | POA: Diagnosis not present

## 2023-04-12 DIAGNOSIS — Z1212 Encounter for screening for malignant neoplasm of rectum: Secondary | ICD-10-CM | POA: Diagnosis not present

## 2023-04-13 ENCOUNTER — Other Ambulatory Visit: Payer: Self-pay | Admitting: Family Medicine

## 2023-04-13 DIAGNOSIS — Z1231 Encounter for screening mammogram for malignant neoplasm of breast: Secondary | ICD-10-CM

## 2023-06-04 ENCOUNTER — Ambulatory Visit: Payer: Medicare PPO | Admitting: Gastroenterology

## 2023-06-05 ENCOUNTER — Ambulatory Visit
Admission: RE | Admit: 2023-06-05 | Discharge: 2023-06-05 | Disposition: A | Discharge status: Home / Self Care | Payer: Medicare PPO | Source: Ambulatory Visit | Attending: Family Medicine | Admitting: Family Medicine

## 2023-06-05 DIAGNOSIS — Z1231 Encounter for screening mammogram for malignant neoplasm of breast: Secondary | ICD-10-CM

## 2023-06-06 ENCOUNTER — Ambulatory Visit: Payer: Medicare PPO | Admitting: Gastroenterology

## 2023-06-28 ENCOUNTER — Ambulatory Visit (INDEPENDENT_AMBULATORY_CARE_PROVIDER_SITE_OTHER): Payer: Medicare PPO | Admitting: Gastroenterology

## 2023-06-28 ENCOUNTER — Encounter (INDEPENDENT_AMBULATORY_CARE_PROVIDER_SITE_OTHER): Payer: Self-pay | Admitting: Gastroenterology

## 2023-06-28 VITALS — BP 136/77 | HR 88 | Temp 97.8°F | Ht 63.0 in | Wt 133.4 lb

## 2023-06-28 DIAGNOSIS — K649 Unspecified hemorrhoids: Secondary | ICD-10-CM

## 2023-06-28 DIAGNOSIS — R195 Other fecal abnormalities: Secondary | ICD-10-CM

## 2023-06-28 NOTE — Patient Instructions (Signed)
I am sending the [employer] hemorrhoid cream for you to use up to 4 times per day You can also do sitz baths to help with discomfort As discussed, Cologuard is intended for the qualitative detection of colorectal neoplasia associated DNA markers and for the presence of occult hemoglobin in human stool. A positive result may indicate the presence of colorectal cancer (CRC) or pre cancerous polyps, and should be followed by colonoscopy. A negative Cologuard test result does not guarantee absence of cancer or pre cancerous polyp.False positives and false negatives do occur.   I would recommend, given your good health that we proceed with colonoscopy to evaluate further the positive cologuard, please consider this and let me know if you wish to proceed  Follow up 3 months  It was a pleasure to see you today. I want to create trusting relationships with patients and provide genuine, compassionate, and quality care. I truly value your feedback! please be on the lookout for a survey regarding your visit with me today. I appreciate your input about our visit and your time in completing this!    [place] Acklin L. Jeanmarie Hubert, MSN, APRN, AGNP-C Adult-Gerontology Nurse Practitioner [facility]

## 2023-06-28 NOTE — Progress Notes (Addendum)
Referring Provider: Assunta Found, MD Primary Care Physician:  Assunta Found, MD Primary GI Physician: Dr. Levon Hedger   Chief Complaint  Patient presents with   Hemorrhoids    Pt has concerns about hemorrhoids. States they are external. States she has tried otc cream and prescription suppositories that have not helped.    HPI:   Angela Flowers is a 82 y.o. female with past medical history of hypothyroidism, HLD   Patient presenting today for hemorrhoids and positive cologuard   Last seen 2022, at that time, having burning, itching in perianal area, denied rectal bleeding, using preparation H, and hydrocortisone with lidocaine. Findings of non thrombosed hemorrhoids on exam.   Recommended to schedule screening colonoscopy, start miralax and witch hazel daily.   Present:  Patient states she had a cologuard with the PCP and it was positive. She denies any blood in her stools. No melena. She does have history of hemorrhoids, did suppositories that did not help. She notes hemorrhoid feels external. She denies any issues with constipation or diarrhea. She denies rectal itching but has some burning and occasional pain with sitting. She is not currently using anything on her hemorrhoids currently. She is having a BM daily. She denies abdominal pain, weight loss, changes in appetite. No family history of CRC  Last EGD: never Last Colonoscopy: 2009 by Dr. Jarold Motto: average risk screening: Findings  - NORMAL EXAM - DIVERTICULOSIS: Splenic Flexure to Sigmoid Colon. Not bleeding.  ICD9: Diverticulosis, Colon: 562.10. Comments: Thickened red folds. - STRICTURE / STENOSIS: Descending Colon. Comments: Strictured  sigmoid area....dilated with the scope   Past Medical History:  Diagnosis Date   Chest pain    Diverticulitis    Dyspnea    Elevated lipids    Hypokalemia    Thyroid disease     Past Surgical History:  Procedure Laterality Date   EYE SURGERY     "Hole in eye".      Current Outpatient Medications  Medication Sig Dispense Refill   levothyroxine (SYNTHROID, LEVOTHROID) 50 MCG tablet Take 50 mcg by mouth daily before breakfast.     naproxen (NAPROSYN) 500 MG tablet Take 1 tablet (500 mg total) by mouth 2 (two) times daily as needed. 30 tablet 0   OVER THE COUNTER MEDICATION Vit D 50 mcg once per day.     Probiotic Product (ALIGN PO) Take by mouth daily at 6 (six) AM.     hydrocortisone (ANUSOL-HC) 2.5 % rectal cream Place 1 application. rectally 2 (two) times daily. (Patient not taking: Reported on 06/28/2023) 60 g 0   hydrocortisone (ANUSOL-HC) 25 MG suppository Place 1 suppository (25 mg total) rectally 2 (two) times daily. (Patient not taking: Reported on 06/28/2023) 20 suppository 0   Lidocaine-Hydrocortisone Ace 3-0.5 % CREA Apply 1 application topically daily. (Patient not taking: Reported on 06/28/2023) 14 g 1   No current facility-administered medications for this visit.    Allergies as of 06/28/2023   (No Known Allergies)    Family History  Problem Relation Age of Onset   Breast cancer Neg Hx     Social History   Socioeconomic History   Marital status: Widowed    Spouse name: Not on file   Number of children: Not on file   Years of education: Not on file   Highest education level: Not on file  Occupational History   Occupation: Full time  Tobacco Use   Smoking status: Former    Passive exposure: Never   Smokeless tobacco: Never  Tobacco comments:    quit 5 yrs ago after smoking for 20 yrs.  Vaping Use   Vaping status: Never Used  Substance and Sexual Activity   Alcohol use: No   Drug use: No   Sexual activity: Not Currently    Birth control/protection: None  Other Topics Concern   Not on file  Social History Narrative   Single   No regular exercise   Social Determinants of Health   Financial Resource Strain: Low Risk  (01/22/2023)   Overall Financial Resource Strain (CARDIA)    Difficulty of Paying Living Expenses:  Not very hard  Food Insecurity: No Food Insecurity (01/22/2023)   Hunger Vital Sign    Worried About Running Out of Food in the Last Year: Never true    Ran Out of Food in the Last Year: Never true  Transportation Needs: No Transportation Needs (01/22/2023)   PRAPARE - Administrator, Civil Service (Medical): No    Lack of Transportation (Non-Medical): No  Physical Activity: Not on file  Stress: Not on file  Social Connections: Not on file    Review of systems General: negative for malaise, night sweats, fever, chills, weight loss Neck: Negative for lumps, goiter, pain and significant neck swelling Resp: Negative for cough, wheezing, dyspnea at rest CV: Negative for chest pain, leg swelling, palpitations, orthopnea GI: denies melena, hematochezia, nausea, vomiting, diarrhea, constipation, dysphagia, odyonophagia, early satiety or unintentional weight loss. +hemorrhoids/rectal pain/rectal itching MSK: Negative for joint pain or swelling, back pain, and muscle pain. Derm: Negative for itching or rash Psych: Denies depression, anxiety, memory loss, confusion. No homicidal or suicidal ideation.  Heme: Negative for prolonged bleeding, bruising easily, and swollen nodes. Endocrine: Negative for cold or heat intolerance, polyuria, polydipsia and goiter. Neuro: negative for tremor, gait imbalance, syncope and seizures. The remainder of the review of systems is noncontributory.  Physical Exam: BP 136/77 (BP Location: Right Arm, Patient Position: Sitting, Cuff Size: Normal)   Pulse 88   Temp 97.8 F (36.6 C) (Oral)   Ht 5\' 3"  (1.6 m)   Wt 133 lb 6.4 oz (60.5 kg)   BMI 23.63 kg/m  General:   Alert and oriented. No distress noted. Pleasant and cooperative.  Head:  Normocephalic and atraumatic. Eyes:  Conjuctiva clear without scleral icterus. Mouth:  Oral mucosa pink and moist. Good dentition. No lesions. Heart: Normal rate and rhythm, s1 and s2 heart sounds present.  Lungs:  Clear lung sounds in all lobes. Respirations equal and unlabored. Abdomen:  +BS, soft, non-tender and non-distended. No rebound or guarding. No HSM or masses noted. Rectal: wendy richards, LPN present as witness, non-thrombosed grade IV hemorrhoid with no stigmata of recent bleeding, no other lesions, DRE with normal sphincter tone, no obvious masses noted, pt tolerated exam without issue. Derm: No palmar erythema or jaundice Msk:  Symmetrical without gross deformities. Normal posture. Extremities:  Without edema. Neurologic:  Alert and  oriented x4 Psych:  Alert and cooperative. Normal mood and affect.  Invalid input(s): "6 MONTHS"   ASSESSMENT: Angela Flowers is a 82 y.o. female presenting today for positive cologuard and hemorrhoids  Patient with c/o non bleeding hemorrhoids, non thrombosed grade IV hemorrhoid noted on exam. She  has burning and some discomfort. Has tried ansuol in the past without improvement. Discussed potential hemorrhoid banding of internal hemorrhoids which may help to alleviate further external presentation of hemorrhoids, however, she has had no recent colonoscopy (last in 2009), ideally would need more recent  colonoscopy prior to banding. She also notably had recent cologuard that was positive. I discussed with her Cologuard is intended for the qualitative detection of colorectal neoplasia associated DNA markers and for the presence of occult hemoglobin in human stool. A positive result may indicate the presence of colorectal cancer (CRC) or pre cancerous polyps, and should be followed by colonoscopy. At this time she is unsure if she wishes to proceed with colonoscopy. Given she is in relatively good health given her age, I did urge her to consider this and let me know if she changes her mind. In the meantime, will call in [facility] hemorrhoid cream compound to use QID, she can also do intermittent sitz baths to help soothe the discomfort.   PLAN:  [place]  hemorrhoid compound cream QID  2. Sitz baths 3. Pt to call back if she wishes to proceed with colonoscopy for +cologuard  All questions were answered, patient verbalized understanding and is in agreement with plan as outlined above.    Follow Up: 3 months   Kamerin Axford L. Jeanmarie Hubert, MSN, APRN, AGNP-C Adult-Gerontology Nurse Practitioner [facility]  I have reviewed the note and agree with the APP's assessment as described in this progress note  As patient had positive Cologuard, she should proceed with colonoscopy . Has not pursued this in the past. Unfortunately, patient is not interested in this for now. She should call us to schedule this soon.  [name], MD Gastroenterology and Hepatology [facility]

## 2023-08-01 ENCOUNTER — Telehealth (INDEPENDENT_AMBULATORY_CARE_PROVIDER_SITE_OTHER): Payer: Self-pay | Admitting: *Deleted

## 2023-08-01 NOTE — Telephone Encounter (Signed)
Patient seen in August for hemorrhoids. She was using the [facility] compound cream for hemorrhoids. Has used it all and states some better but still having burning. She has also tried prescription suppositories that did not help.   [employer]   [phone]

## 2023-08-01 NOTE — Telephone Encounter (Signed)
Left message to return call 

## 2023-08-01 NOTE — Telephone Encounter (Signed)
Discussed with patient and she declined to make appt for colonoscopy. She still asking if you can send in something for hemorrhoids.

## 2023-08-02 NOTE — Telephone Encounter (Signed)
Pt wanted [place] compound cream and I called in to [facility]. Pt is aware pharmacy will call her when ready.

## 2023-08-24 DIAGNOSIS — Z23 Encounter for immunization: Secondary | ICD-10-CM | POA: Diagnosis not present

## 2023-08-30 NOTE — Telephone Encounter (Signed)
error 

## 2023-10-08 ENCOUNTER — Ambulatory Visit (INDEPENDENT_AMBULATORY_CARE_PROVIDER_SITE_OTHER): Payer: Medicare PPO | Admitting: Gastroenterology

## 2024-04-29 ENCOUNTER — Other Ambulatory Visit: Payer: Self-pay | Admitting: Family Medicine

## 2024-04-29 DIAGNOSIS — Z1231 Encounter for screening mammogram for malignant neoplasm of breast: Secondary | ICD-10-CM

## 2024-05-06 DIAGNOSIS — Z1331 Encounter for screening for depression: Secondary | ICD-10-CM | POA: Diagnosis not present

## 2024-05-06 DIAGNOSIS — Z6824 Body mass index (BMI) 24.0-24.9, adult: Secondary | ICD-10-CM | POA: Diagnosis not present

## 2024-05-06 DIAGNOSIS — E039 Hypothyroidism, unspecified: Secondary | ICD-10-CM | POA: Diagnosis not present

## 2024-05-06 DIAGNOSIS — Z9229 Personal history of other drug therapy: Secondary | ICD-10-CM | POA: Diagnosis not present

## 2024-05-06 DIAGNOSIS — D518 Other vitamin B12 deficiency anemias: Secondary | ICD-10-CM | POA: Diagnosis not present

## 2024-05-06 DIAGNOSIS — Z0001 Encounter for general adult medical examination with abnormal findings: Secondary | ICD-10-CM | POA: Diagnosis not present

## 2024-05-06 DIAGNOSIS — G9332 Myalgic encephalomyelitis/chronic fatigue syndrome: Secondary | ICD-10-CM | POA: Diagnosis not present

## 2024-05-06 DIAGNOSIS — E559 Vitamin D deficiency, unspecified: Secondary | ICD-10-CM | POA: Diagnosis not present

## 2024-06-05 ENCOUNTER — Ambulatory Visit
Admission: RE | Admit: 2024-06-05 | Discharge: 2024-06-05 | Disposition: A | Discharge status: Home / Self Care | Source: Ambulatory Visit | Attending: Family Medicine | Admitting: Family Medicine

## 2024-06-05 DIAGNOSIS — Z1231 Encounter for screening mammogram for malignant neoplasm of breast: Secondary | ICD-10-CM
